# Patient Record
Sex: Female | Born: 1965 | Race: Black or African American | Hispanic: No | Marital: Married | Smoking: Never smoker
Health system: Southern US, Community
[De-identification: ages and names within clinical notes are randomized; demographics above are authoritative.]

## PROBLEM LIST (undated history)

## (undated) DIAGNOSIS — D071 Carcinoma in situ of vulva: Secondary | ICD-10-CM

## (undated) DIAGNOSIS — Z8619 Personal history of other infectious and parasitic diseases: Secondary | ICD-10-CM

## (undated) DIAGNOSIS — L280 Lichen simplex chronicus: Secondary | ICD-10-CM

## (undated) DIAGNOSIS — I10 Essential (primary) hypertension: Secondary | ICD-10-CM

## (undated) DIAGNOSIS — N736 Female pelvic peritoneal adhesions (postinfective): Secondary | ICD-10-CM

## (undated) DIAGNOSIS — Z86018 Personal history of other benign neoplasm: Secondary | ICD-10-CM

## (undated) DIAGNOSIS — M6289 Other specified disorders of muscle: Secondary | ICD-10-CM

## (undated) DIAGNOSIS — F419 Anxiety disorder, unspecified: Secondary | ICD-10-CM

## (undated) DIAGNOSIS — T8859XA Other complications of anesthesia, initial encounter: Secondary | ICD-10-CM

## (undated) DIAGNOSIS — J45998 Other asthma: Secondary | ICD-10-CM

## (undated) DIAGNOSIS — N83201 Unspecified ovarian cyst, right side: Secondary | ICD-10-CM

## (undated) DIAGNOSIS — G8929 Other chronic pain: Secondary | ICD-10-CM

## (undated) DIAGNOSIS — E282 Polycystic ovarian syndrome: Secondary | ICD-10-CM

## (undated) DIAGNOSIS — Z8742 Personal history of other diseases of the female genital tract: Secondary | ICD-10-CM

## (undated) DIAGNOSIS — R102 Pelvic and perineal pain: Secondary | ICD-10-CM

## (undated) DIAGNOSIS — Z86718 Personal history of other venous thrombosis and embolism: Secondary | ICD-10-CM

## (undated) DIAGNOSIS — T4145XA Adverse effect of unspecified anesthetic, initial encounter: Secondary | ICD-10-CM

## (undated) DIAGNOSIS — N9489 Other specified conditions associated with female genital organs and menstrual cycle: Secondary | ICD-10-CM

## (undated) HISTORY — DX: Female pelvic peritoneal adhesions (postinfective): N73.6

## (undated) HISTORY — DX: Pelvic and perineal pain: R10.2

## (undated) HISTORY — PX: CARPAL TUNNEL RELEASE: SHX101

## (undated) HISTORY — PX: WISDOM TOOTH EXTRACTION: SHX21

## (undated) HISTORY — DX: Personal history of other venous thrombosis and embolism: Z86.718

## (undated) HISTORY — DX: Other chronic pain: G89.29

## (undated) HISTORY — DX: Personal history of other diseases of the female genital tract: Z87.42

## (undated) HISTORY — DX: Anxiety disorder, unspecified: F41.9

## (undated) HISTORY — PX: OTHER SURGICAL HISTORY: SHX169

## (undated) HISTORY — DX: Polycystic ovarian syndrome: E28.2

---

## 1997-08-22 ENCOUNTER — Inpatient Hospital Stay (HOSPITAL_COMMUNITY): Admission: AD | Admit: 1997-08-22 | Discharge: 1997-08-22 | Payer: Self-pay | Admitting: Obstetrics and Gynecology

## 1997-09-18 ENCOUNTER — Other Ambulatory Visit: Admission: RE | Admit: 1997-09-18 | Discharge: 1997-09-18 | Payer: Self-pay | Admitting: Obstetrics and Gynecology

## 1997-11-12 ENCOUNTER — Other Ambulatory Visit: Admission: RE | Admit: 1997-11-12 | Discharge: 1997-11-12 | Payer: Self-pay | Admitting: Gynecology

## 1998-06-08 ENCOUNTER — Emergency Department (HOSPITAL_COMMUNITY): Admission: EM | Admit: 1998-06-08 | Discharge: 1998-06-08 | Payer: Self-pay | Admitting: Emergency Medicine

## 1998-06-08 ENCOUNTER — Encounter: Payer: Self-pay | Admitting: Emergency Medicine

## 1998-09-14 ENCOUNTER — Other Ambulatory Visit: Admission: RE | Admit: 1998-09-14 | Discharge: 1998-09-14 | Payer: Self-pay | Admitting: Obstetrics and Gynecology

## 1999-08-17 ENCOUNTER — Other Ambulatory Visit: Admission: RE | Admit: 1999-08-17 | Discharge: 1999-08-17 | Payer: Self-pay | Admitting: Obstetrics and Gynecology

## 1999-11-09 ENCOUNTER — Emergency Department (HOSPITAL_COMMUNITY): Admission: EM | Admit: 1999-11-09 | Discharge: 1999-11-09 | Payer: Self-pay | Admitting: Emergency Medicine

## 2000-08-29 ENCOUNTER — Other Ambulatory Visit: Admission: RE | Admit: 2000-08-29 | Discharge: 2000-08-29 | Payer: Self-pay | Admitting: Obstetrics and Gynecology

## 2001-02-19 ENCOUNTER — Inpatient Hospital Stay (HOSPITAL_COMMUNITY): Admission: AD | Admit: 2001-02-19 | Discharge: 2001-02-19 | Payer: Self-pay | Admitting: Obstetrics and Gynecology

## 2001-03-02 ENCOUNTER — Emergency Department (HOSPITAL_COMMUNITY): Admission: EM | Admit: 2001-03-02 | Discharge: 2001-03-02 | Payer: Self-pay | Admitting: Emergency Medicine

## 2001-07-29 ENCOUNTER — Encounter (INDEPENDENT_AMBULATORY_CARE_PROVIDER_SITE_OTHER): Payer: Self-pay | Admitting: *Deleted

## 2001-07-29 ENCOUNTER — Ambulatory Visit (HOSPITAL_BASED_OUTPATIENT_CLINIC_OR_DEPARTMENT_OTHER): Admission: RE | Admit: 2001-07-29 | Discharge: 2001-07-29 | Payer: Self-pay | Admitting: Specialist

## 2001-07-29 HISTORY — PX: BREAST REDUCTION SURGERY: SHX8

## 2001-08-24 ENCOUNTER — Inpatient Hospital Stay (HOSPITAL_COMMUNITY): Admission: EM | Admit: 2001-08-24 | Discharge: 2001-08-25 | Payer: Self-pay | Admitting: *Deleted

## 2001-09-10 ENCOUNTER — Other Ambulatory Visit: Admission: RE | Admit: 2001-09-10 | Discharge: 2001-09-10 | Payer: Self-pay | Admitting: Obstetrics and Gynecology

## 2002-06-26 DIAGNOSIS — Z86718 Personal history of other venous thrombosis and embolism: Secondary | ICD-10-CM

## 2002-06-26 HISTORY — DX: Personal history of other venous thrombosis and embolism: Z86.718

## 2002-08-15 ENCOUNTER — Encounter: Payer: Self-pay | Admitting: Obstetrics and Gynecology

## 2002-08-15 ENCOUNTER — Ambulatory Visit (HOSPITAL_COMMUNITY): Admission: RE | Admit: 2002-08-15 | Discharge: 2002-08-15 | Payer: Self-pay | Admitting: Obstetrics and Gynecology

## 2002-09-24 ENCOUNTER — Other Ambulatory Visit: Admission: RE | Admit: 2002-09-24 | Discharge: 2002-09-24 | Payer: Self-pay | Admitting: Obstetrics and Gynecology

## 2003-03-17 ENCOUNTER — Ambulatory Visit (HOSPITAL_COMMUNITY): Admission: RE | Admit: 2003-03-17 | Discharge: 2003-03-17 | Payer: Self-pay | Admitting: Obstetrics and Gynecology

## 2003-03-17 ENCOUNTER — Encounter: Payer: Self-pay | Admitting: Obstetrics and Gynecology

## 2003-08-10 ENCOUNTER — Emergency Department (HOSPITAL_COMMUNITY): Admission: EM | Admit: 2003-08-10 | Discharge: 2003-08-10 | Payer: Self-pay

## 2003-09-12 ENCOUNTER — Emergency Department (HOSPITAL_COMMUNITY): Admission: AD | Admit: 2003-09-12 | Discharge: 2003-09-12 | Payer: Self-pay | Admitting: Family Medicine

## 2003-09-29 ENCOUNTER — Other Ambulatory Visit: Admission: RE | Admit: 2003-09-29 | Discharge: 2003-09-29 | Payer: Self-pay | Admitting: Obstetrics and Gynecology

## 2003-12-09 ENCOUNTER — Emergency Department (HOSPITAL_COMMUNITY): Admission: EM | Admit: 2003-12-09 | Discharge: 2003-12-09 | Payer: Self-pay | Admitting: Emergency Medicine

## 2004-06-16 ENCOUNTER — Encounter: Admission: RE | Admit: 2004-06-16 | Discharge: 2004-06-16 | Payer: Self-pay | Admitting: Internal Medicine

## 2004-09-07 ENCOUNTER — Encounter: Admission: RE | Admit: 2004-09-07 | Discharge: 2004-12-06 | Payer: Self-pay | Admitting: Occupational Medicine

## 2004-10-05 ENCOUNTER — Other Ambulatory Visit: Admission: RE | Admit: 2004-10-05 | Discharge: 2004-10-05 | Payer: Self-pay | Admitting: Obstetrics and Gynecology

## 2004-11-08 ENCOUNTER — Ambulatory Visit (HOSPITAL_COMMUNITY): Admission: RE | Admit: 2004-11-08 | Discharge: 2004-11-08 | Payer: Self-pay | Admitting: Obstetrics and Gynecology

## 2005-02-02 ENCOUNTER — Encounter (INDEPENDENT_AMBULATORY_CARE_PROVIDER_SITE_OTHER): Payer: Self-pay | Admitting: Specialist

## 2005-02-02 ENCOUNTER — Ambulatory Visit (HOSPITAL_COMMUNITY): Admission: RE | Admit: 2005-02-02 | Discharge: 2005-02-02 | Payer: Self-pay | Admitting: Obstetrics and Gynecology

## 2005-02-02 HISTORY — PX: OTHER SURGICAL HISTORY: SHX169

## 2005-04-08 ENCOUNTER — Ambulatory Visit (HOSPITAL_COMMUNITY): Admission: RE | Admit: 2005-04-08 | Discharge: 2005-04-08 | Payer: Self-pay | Admitting: Family Medicine

## 2005-08-21 ENCOUNTER — Encounter: Admission: RE | Admit: 2005-08-21 | Discharge: 2005-08-21 | Payer: Self-pay | Admitting: Internal Medicine

## 2005-09-14 ENCOUNTER — Encounter (INDEPENDENT_AMBULATORY_CARE_PROVIDER_SITE_OTHER): Payer: Self-pay | Admitting: *Deleted

## 2005-09-14 ENCOUNTER — Inpatient Hospital Stay (HOSPITAL_COMMUNITY): Admission: RE | Admit: 2005-09-14 | Discharge: 2005-09-16 | Payer: Self-pay | Admitting: Obstetrics and Gynecology

## 2005-09-14 HISTORY — PX: ABDOMINAL HYSTERECTOMY: SHX81

## 2006-07-17 ENCOUNTER — Emergency Department (HOSPITAL_COMMUNITY): Admission: EM | Admit: 2006-07-17 | Discharge: 2006-07-17 | Payer: Self-pay | Admitting: Emergency Medicine

## 2007-05-15 ENCOUNTER — Encounter: Admission: RE | Admit: 2007-05-15 | Discharge: 2007-05-15 | Payer: Self-pay | Admitting: Internal Medicine

## 2007-05-17 ENCOUNTER — Encounter: Admission: RE | Admit: 2007-05-17 | Discharge: 2007-05-17 | Payer: Self-pay | Admitting: Internal Medicine

## 2007-07-01 ENCOUNTER — Encounter: Admission: RE | Admit: 2007-07-01 | Discharge: 2007-07-01 | Payer: Self-pay | Admitting: Obstetrics and Gynecology

## 2008-03-10 ENCOUNTER — Emergency Department (HOSPITAL_COMMUNITY): Admission: EM | Admit: 2008-03-10 | Discharge: 2008-03-11 | Payer: Self-pay | Admitting: Emergency Medicine

## 2008-04-20 ENCOUNTER — Encounter: Admission: RE | Admit: 2008-04-20 | Discharge: 2008-04-20 | Payer: Self-pay | Admitting: Obstetrics and Gynecology

## 2008-10-12 ENCOUNTER — Ambulatory Visit: Payer: Self-pay | Admitting: *Deleted

## 2008-10-12 ENCOUNTER — Encounter (INDEPENDENT_AMBULATORY_CARE_PROVIDER_SITE_OTHER): Payer: Self-pay | Admitting: Internal Medicine

## 2008-10-12 ENCOUNTER — Ambulatory Visit (HOSPITAL_COMMUNITY): Admission: RE | Admit: 2008-10-12 | Discharge: 2008-10-12 | Payer: Self-pay | Admitting: Internal Medicine

## 2008-12-12 ENCOUNTER — Emergency Department (HOSPITAL_COMMUNITY): Admission: EM | Admit: 2008-12-12 | Discharge: 2008-12-12 | Payer: Self-pay | Admitting: Family Medicine

## 2009-11-24 ENCOUNTER — Encounter: Admission: RE | Admit: 2009-11-24 | Discharge: 2009-11-24 | Payer: Self-pay | Admitting: Internal Medicine

## 2009-12-10 ENCOUNTER — Encounter: Admission: RE | Admit: 2009-12-10 | Discharge: 2009-12-10 | Payer: Self-pay | Admitting: Internal Medicine

## 2010-11-11 NOTE — Op Note (Signed)
NAMEELLIETT, [name]               ACCOUNT NO.:  [id number]   MEDICAL RECORD NO.:  [id number]          PATIENT TYPE:  AMB   LOCATION:  SDC                           FACILITY:  [facility]   PHYSICIAN:  Hal Morales, M.D.DATE OF BIRTH:  1965/10/15   DATE OF PROCEDURE:  02/02/2005  DATE OF DISCHARGE:                                 OPERATIVE REPORT   PREOPERATIVE DIAGNOSES:  1.  Abnormal uterine bleeding.  2.  Uterine fibroids.  3.  Polycystic ovarian syndrome.   POSTOPERATIVE DIAGNOSES:  1.  Abnormal uterine bleeding.  2.  Uterine fibroids.  3.  Polycystic ovarian syndrome.   OPERATION:  1.  Hysteroscopy.  2.  Resection of uterine fibroids.  3.  Dilatation and curettage.   SURGEON:  Dr. Dierdre Forth.   ANESTHESIA:  General LMA.   ESTIMATED BLOOD LOSS:  Less than 50 mL.   COMPLICATIONS:  None.   FINDINGS:  The uterus sounded to 7 cm.  There were two small uterine  fibroids that were submucosal, one at the right posterior uterus and one at  the left posterior uterus, both approximately 1 cm in diameter.  The  endometrial cavity fluid for the most part was atrophic, though there were  some areas with evidence of thicker endometrium.  The tubal ostia were  visualized on either side.   PROCEDURE:  The patient was taken to the operating room after appropriate  identification and placed on the operating table.  After the attainment of  general anesthesia, she was placed in the lithotomy position.  The perineum  and vagina were prepped with multiple layers of Betadine and the bladder  emptied with a red Robinson catheter.  The perineum was draped as a sterile  field.  A Graves speculum was placed in the vagina and a paracervical block  achieved with a total of 10 mL of 2% Xylocaine in the 5 and 7 o'clock  positions.  A single-tooth tenaculum was placed on the cervix and the uterus  sounded.  The cervix was then dilated to accommodate the diagnostic  hysteroscope, and the  above-noted findings were made and documented.  The  cervix was further dilated to accommodate the operative hysteroscope, and it  was used to shave the aforementioned right and left posterior submucosal  fibroids from the submucosa.  Those specimens were removed, and the uterus  was curetted in all four  quadrants.  Hemostasis was noted to be adequate, and all instruments were  removed from the vagina.  The fluid deficit was noted to be 80 mL.   SPECIMENS TO PATHOLOGY:  Endometrial curettings and fibroids.      Hal Morales, M.D.  Electronically Signed     VPH/MEDQ  D:  02/02/2005  T:  02/02/2005  Job:  [id number]

## 2010-11-11 NOTE — H&P (Signed)
NAMEHEMA, LANZA NO.:  [id number]   MEDICAL RECORD NO.:  [id number]           PATIENT TYPE:   LOCATION:                                 FACILITY:   PHYSICIAN:  Hal Morales, M.D.DATE OF BIRTH:  [birth year]   DATE OF ADMISSION:  DATE OF DISCHARGE:                                HISTORY & PHYSICAL   HISTORY OF PRESENT ILLNESS:  [name] is a 45 year old married African-  American female, gravida 0, who presents for a total abdominal hysterectomy  because of chronic pelvic pain, chronic pelvic inflammatory disease,  polycystic ovarian syndrome, and uterine fibroids.  For over 15 years the  patient has suffered with severe (10/10 on a 10-point pain scale) menstrual  cramps along with random and episodic pelvic pain which is greater on the  left than the right.  She knows of no aggravating factors but has only found  significant relief with Wygesic analgesia, and on occasion, nonsteroidal  anti-inflammatory medications.  After three laparoscopies for pelvic pain  and lysis of adhesions the patient was seen at  [facility] by a pelvic  pain specialist.  It was concluded that in addition to her history of PID  and pelvic adhesive disease that the patient's polycystic ovarian disease  was a significant contributing factor to her pelvic discomfort.  To manage  the polycystic ovarian effect the patient was placed on birth control pills  which helped somewhat and regulated her pattern of oligomenorrhea.  While  trying to conceive the patient again resumed a pattern of irregular bleeding  and severe pelvic pain.  Having developed a DVT in 2003, oral contraceptives  were no longer a management option, therefore the patient was given a  progestogen episodically to manage her bleeding.  Additionally a narcotic  along with nonsteroidal anti-inflammatory pain medicines were also given for  her discomfort.  Eventually the patient began bleeding heavily for as many  as 14 days accompanied by clotting and debilitating pain in spite of her  therapies.  She was then placed on Lupron-Depo for six months to which the  patient responded with a menorrhea and a significant decrease in her pelvic  pain.  Following this course of treatment, Provera and Wygesic were  reinitiated.  In spite of this the patient again resumed an irregular  bleeding pattern.  Pelvic ultrasound in May of 2006 showed an  intramural/submucosal fibroid measuring 3.5 x 3.8 x 3.9 cm and a posterior  intramural fibroid measuring 18 x 19 x 15 mm.  The patient underwent a  hysteroscopy with D&C and fibroid resection in August of 2006 for this  finding.  After this procedure the patient's bleeding normalized somewhat in  that it was regular; however, her flow was for nine days during which time  she would change a pad every 35-40 minutes with occasional soiling of her  clothes.  The pelvic pain she once endured persisted with the addition of  severe cramping one week prior to her menstrual flow which radiated down her  legs and was quite debilitating.  By this time the patient's  pain only  minimally responded to her previous analgesic therapy.  Given the chronicity  of her symptoms along with its debilitating effect and disruption to her  lifestyle, the patient has opted to pursue definitive therapy in the form of  hysterectomy.   OB HISTORY:  Gravida 0.   GYN HISTORY:  Menarche 45 years old.  Last menstrual period was August 23, 2005  (see history of present illness).  The patient does not use anything  for contraception.  She does have a remote history of gonorrhea.  She also  has Herpes simplex virus #2.  She has a history of an abnormal Pap smear in  1994; however, Pap smears have remained normal since that time with her most  recent being April 2006.   PAST MEDICAL HISTORY:  1.  DVT (following reduction mammoplasty surgery while on oral      contraceptives; the patient's  coagulopathy studies proved negative).  2.  Irritable bowel syndrome.  3.  Polycystic ovarian syndrome.  4.  Fibroids.  5.  Infertility.  6.  Pelvic inflammatory disease.  7.  Insulin resistance.  8.  Metabolic syndrome.  9.  Asthma.  10. Anxiety.   FAMILY HISTORY:  Positive for hypertension, diabetes, and peptic ulcer  disease.   PAST SURGICAL HISTORY:  1.  1994, diagnostic laparoscopy with lysis of adhesions.  2.  1995, diagnostic laparoscopy with lysis of adhesions.  3.  1996, diagnostic laparoscopy with lysis of adhesions.  4.  2003, reduction mammoplasty.  5.  2006, hysteroscopy with resection of submucosal fibroid.  The patient      denies any problems with anesthesia and denies blood transfusions.   HABITS:  She does not use alcohol or tobacco.   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   CURRENT MEDICATIONS:  1.  Hydrochlorothiazide 12.5 mg daily.  2.  Avandia 4 mg daily.  3.  Vitamin E 400 international units daily.   ALLERGIES:  1.  The patient is allergic to [facility] which causes itching.  2.  VICODIN causes itching.  3.  The patient has been advised to avoid NONSTEROIDAL ANTI-INFLAMMATORY      MEDICATIONS due to her history of gastrointestinal problems.   REVIEW OF SYSTEMS:  Seasonal allergies.  The patient does not wear glasses  or dentures.  Except as mentioned in history of present illness, review of  systems is negative.   PHYSICAL EXAMINATION:  VITAL SIGNS:  Blood pressure 126/80, height 5 feet 2  inches tall, weight 212 pounds.  NECK:  Supple without masses.  There is no adenopathy or thyromegaly.  HEART:  Regular rate and rhythm.  There is no murmur.  LUNGS:  Clear to auscultation.  There are no wheezes, rales, or rhonchi.  BACK:  No CVA tenderness.  ABDOMEN:  Bowel sounds are present.  It is soft.  It is diffusely tender without organomegaly.  The patient is noted to have  a tender erythematous 4  x 3 cm macular area with a central 0.5 cm tender, pigmented, fluctuant  lesion without pointing.  EXTREMITIES:  Without clubbing, cyanosis, or edema.  PELVIC:  EGBUS is within normal limits.  Vagina is rugous.  Cervix is  nontender without lesions.  Uterus appears upper limits of normal size,  though exam is limited by body habitus.  There is some tenderness over  apparent fundus.  Adnexa without tenderness or masses.  Rectovaginal  without  tenderness or masses.   IMPRESSION:  1.  Pelvic pain.  2.  Chronic pelvic inflammatory disease.  3.  Uterine fibroids.  4.  Polycystic ovarian syndrome.  5.  Left abdominal lesion.   DISPOSITION:  A discussion was held with the patient regarding the  indications for her procedure along with this risk, which include, but are  not limited to, reaction to anesthesia, damage to adjacent organs,  infection, or excessive bleeding.  The patient also understands that relief  of her pelvic pain cannot be guaranteed by this procedure and that it may  become necessary to remove one or both of her ovaries should they prove to  be diseased.  The patient has consented to proceed with a total abdominal  hysterectomy at [facility] on September 14, 2005, at 7:30  a.m.      Elmira J. Adline Peals.      Hal Morales, M.D.  Electronically Signed    EJP/MEDQ  D:  09/09/2005  T:  09/09/2005  Job:  [id number]

## 2010-11-11 NOTE — H&P (Signed)
NAME:  [name], CELLI NO.:  [id number]   MEDICAL RECORD NO.:  [id number]           PATIENT TYPE:   LOCATION:                                 FACILITY:   PHYSICIAN:  Hal Morales, M.D.     DATE OF BIRTH:   DATE OF ADMISSION:  02/02/2005  DATE OF DISCHARGE:                                HISTORY & PHYSICAL   HISTORY OF PRESENT ILLNESS:  The patient is a 45 year old black single  female para 0 who presents for evaluation and management of abnormal uterine  bleeding.  She gives a three-month history of menses which have occurred  with intramenstrual bleeding off and on, especially after intercourse.  She  had a normal Pap smear in April of 2006 and has had normal Pap smears since  a single episode of ASCUS on Pap in 1994.  She had spontaneous menses on May  24 lasting for three days and took Prometrium on June 14.  She started  bleeding on June 27 and continued through June 10.  She had intramenstrual  bleeding off and on between those two identifiable menstrual periods.   She has a long history of irregular menses with polycystic ovarian syndrome  and insulin resistance and often has menses only if she takes progesterone  for withdrawal.   She has a history of uterine fibroids with the first diagnosis being made in  2004 by ultrasound.  She has a long history of pelvic pain which has been  attributed to her polycystic ovarian syndrome and history of chronic pelvic  inflammatory disease.   PAST MEDICAL HISTORY:  1.  Deep venous thrombosis in 2003 after a reduction mammoplasty while she      was on oral contraceptives.  She was treated for heparin and then      Coumadin for six months with no further therapy and no further      difficulty.  2.  Insulin resistance diagnosed in 2001 based on high insulin levels and      treated initially with Glucophage and most recently with Avandia.  3.  Probable metabolic syndrome with elevated total cholesterol and  elevated      LDL cholesterol.  4.  Irritable bowel syndrome followed by Dr. Matthias Hughs.   GYN HISTORY:  The patient has a long history of infertility thought to be  multifactorial with chronic anovulation, pelvic adhesive disease, and  suggestion of salpingitis isthmica nodosum, history of pelvic inflammatory  disease treated with intravenous antibiotics diagnosed at laparoscopy,  history of gonorrhea in 1991 treated as an outpatient, history of ASCUS on  Pap in 1994 with no therapy required and normal Pap smears since that time,  chronic pelvic pain treated in 1999 with Lupron with no improvement, HSV II  with infrequent outbreaks.   FAMILY HISTORY:  Positive for hypertension and diabetes.   REVIEW OF SYSTEMS:  Positive for the above mentioned intramenstrual  bleeding, intermittent pelvic pain especially with menses, current complaint  of gray vaginal discharge noted to be consistent with bacterial vaginosis on  examination on January 25, 2005.  CURRENT MEDICATIONS:  1.  Hydrochlorothiazide 25 mg a day.  2.  Avandia 4 mg a day.  3.  Zyrtec one tablet p.o. daily p.r.n.  4.  Multivitamins.   ALLERGIES:  PERCOCET.   PHYSICAL EXAMINATION:  GENERAL:  The patient is an obese black female in no  acute distress.  VITAL SIGNS:  Blood pressure 130/80.  LUNGS:  Clear.  HEART:  Regular rate and rhythm.  ABDOMEN:  Obese without palpable masses or organomegaly.  There is no point  tenderness and no rebound.  PELVIC:  EGBUS within normal limits.  The vagina is rugose.  The cervix is  without gross lesions.  The uterus does not feel enlarged but the  examination is compromised by body habitus.  Adnexa:  No palpable masses.  Rectovaginal confirms.   IMPRESSION:  1.  Abnormal uterine bleeding.  2.  History of fibroids.  3.  Polycystic ovarian syndrome.  4.  Recently treated bacterial vaginosis.  5.  Obesity.  6.  History of pelvic inflammatory disease with pelvic adhesions.   DISPOSITION:   Discussion is held with the patient concerning the management  of her abnormal uterine bleeding and the suggestion made for hysteroscopy,  removal of any intracavitary fibroids, and D&C.  The suggestion is made  specifically because the patient has a history of polycystic ovarian  syndrome and is at risk for endometrial hyperplasia.  The risks of  anesthesia, bleeding, infection, damage to adjacent organs, and possible  perforation of the uterus were explained to the patient and she seemed to  understand.  Her questions were answered and she wishes to proceed with the  aforementioned surgical procedure at [facility] on February 02, 2005.      Hal Morales, M.D.  Electronically Signed     VPH/MEDQ  D:  02/02/2005  T:  02/02/2005  Job:  [id number]

## 2010-11-11 NOTE — Op Note (Signed)
[facility]  Patient:    [name], [name] Visit Number: [id number] MRN: [id number]          Service Type: DSU Location: [facility] Attending Physician:  Gustavus Messing Dictated by:   Yaakov Guthrie. Shon Hough, M.D. Admit Date:  07/29/2001                             Operative Report  INDICATIONS:  [age]-year-old lady with severe, severe macromastia, wears a 44-E bra, increased pain and discomfort and pitting in both shoulder areas and history of intertrigo.  PREOPERATIVE DIAGNOSIS:  POSTOPERATIVE DIAGNOSIS:  PROCEDURES:  Bilateral breast reductions using the inferior pedicle technique.  SURGEON:  Yaakov Guthrie. Shon Hough, M.D.  ASSISTANT:  Margaretha Sheffield.  ANESTHESIA:  General anesthesia.  DESCRIPTION OF PROCEDURE:  Preoperatively, the patient was sat up and drawn for the inferior pedicle reduction mammoplasties, remarking the nipple-areolar complexes from over 38 cm from the suprasternal notch to 20.  She then underwent general anesthesia and intubated orally.  A prep was done to the chest and breast areas in a routine fashion using Betadine soaping solution and walled off with sterile towels and drapes so as to make a sterile field. Xylocaine 0.25% in 1:400,000 concentration with epinephrine was injected, 150 cc per side.  After waiting an appropriate amount of time, the wounds were scored with #15 blades, then the skin of the inferior pedicles de-epithelialized using #20 blades.  The new keyhole areas were debulked. Hemostasis was maintained with the Bovie unit throughout the procedure or coagulation.  Next, laterally, more tissue was removed to improve the symmetry and then accessory breast tissue was removed directly using the Metzenbaum scissors and the Bovie as well as liposuction assistance, removing approximately 500 cc of accessory breast tissue.  After proper hemostasis, the flaps were transposed and stayed with 3-0 Prolene.   Subcutaneous closure was done with 3-0 Monocryl x2 layers and then running subcuticular stitches of 3-0 Monocryl and 5-0 Monocryl throughout the inverted T.  The wounds were drained with #15 and #10 Blake drains which were placed in the depths of the wounds and brought out through the lateral-most portion of the incisions and secured with 3-0 Prolene.  Steri-Strips and soft dressings were applied to all the areas including one-inch paper tape, Xeroform, 4 x 4s, ABDs and Hypafix. She withstood the procedures very well and was taken to recovery in excellent condition. Dictated by:   Yaakov Guthrie. Shon Hough, M.D. Attending Physician:  Gustavus Messing DD:  07/29/01 TD:  07/29/01 Job: [id number]

## 2010-11-11 NOTE — Op Note (Signed)
[name], [name]               ACCOUNT NO.:  [id number]   MEDICAL RECORD NO.:  [id number]          PATIENT TYPE:  INP   LOCATION:  9303                          FACILITY:  [facility]   PHYSICIAN:  Hal Morales, M.D.DATE OF BIRTH:  [birth year]   DATE OF PROCEDURE:  09/14/2005  DATE OF DISCHARGE:                                 OPERATIVE REPORT   PREOPERATIVE DIAGNOSIS:  Is uterine fibroids, pelvic pain, chronic pelvic  inflammatory disease, polycystic ovarian syndrome.   POSTOPERATIVE DIAGNOSES:  Is uterine fibroids, pelvic pain, chronic pelvic  inflammatory disease, polycystic ovarian syndrome.   OPERATION:  Total abdominal hysterectomy, bilateral salpingectomy.   SURGEON:  Hal Morales, M.D.   FIRST ASSISTANT:  Elmira J. Adline Peals.   ANESTHESIA:  Combination spinal and epidural.   ESTIMATED BLOOD LOSS:  100 mL.   COMPLICATIONS:  None.   FINDINGS:  The uterus was upper limits normal size with a darkened area on  the posterior left cornual region suspicious for endometriosis. The tubes  were normal bilaterally except for adherent at the fimbrial end to the  ovaries. The ovaries were slightly enlarged bilaterally and symmetrically  consistent with polycystic ovarian syndrome. There were filmy adhesions  between the ovaries and posterior uterus and left pelvic sidewall.   PROCEDURE:  The patient was taken to the operating room after appropriate  identification and placed on the operating table. After placement of  regional anesthesia  she was placed in the supine position with a left  lateral tilt. The abdomen, perineum and vagina were prepped with multiple  layers of Betadine and a Foley catheter inserted into the bladder under  sterile conditions and connected to straight drainage. The abdomen was  draped in sterile field. After assuring adequate surgical anesthesia. A  suprapubic incision was made and the abdomen opened in layers. The  peritoneum was entered and  the self-retaining O'Connor-O'Sullivan retractor  placed. The bowel was packed cephalad. The uterus was elevated with Kelly  clamps at the cornual regions and the left round ligament suture ligated,  incised and that incision taken anteriorly on the anterior leaf of the broad  ligament. The utero-ovarian ligament was then clamped, cut and suture  ligated. The similar procedure was carried out on the right side with the  round ligament and utero-ovarian ligament. The uterine arteries were  skeletonized and clamped, cut and suture ligated on the right and left side.  The parametrial tissues, paracervical tissues, uterosacral ligament and  vaginal angles were successively clamped, cut and suture ligated and then  the cervix excised from the upper vagina. The vaginal cuff was closed with  figure-of-eight sutures of 0 Vicryl. The sutures holding the uterosacral  ligaments and vaginal angles were held and then tied together. Copious  irrigation was carried out and hemostasis achieved in the vaginal cuff with  Bovie cautery. The right fallopian tube was then elevated, clamped, and  excised and the cut ends tied with suture ligatures of 2-0 Vicryl. This was  achieved on the right and left side for removal of both the tubes from the  adjacent ovary.  They were removed from the operative field. Once copious  irrigation had been carried out and hemostasis noted to be adequate. All  instruments were removed from the peritoneal cavity and the abdominal  peritoneum closed with a running suture of 2-0 Vicryl. The rectus muscles  were reapproximated in the midline and copious irrigation carried out.  Hemostasis was achieved in the rectus muscles with Bovie cautery. The rectus  fascia was closed with running sutures from each apex to the midline and  tied in midline. A 7 mm Jackson-Pratt drain was placed in the subcutaneous  tissue through a stab wound in the left lower quadrant and tied down with a  suture  of 0 silk. The skin incision was closed with a subcuticular suture of  3-0 Monocryl.   It should be noted that after the round ligament had been suture ligated and  incised on either side, the bladder was sharply dissected off the anterior  cervix prior to clamping the uterine arteries on the right and left sides.  All sutures used except as mentioned were 0 Vicryl.      Hal Morales, M.D.  Electronically Signed     VPH/MEDQ  D:  09/14/2005  T:  09/15/2005  Job:  [id number]

## 2010-11-11 NOTE — H&P (Signed)
[place]  Patient:    [name], [name] Visit Number: [id number] MRN: [id number]          Service Type: MED Location: 1E 0101 01 Attending Physician:  Corlis Leak. Dictated by:   Wayne C. Dorna Bloom, M.D. Admit Date:  08/24/2001   CC:         Gracelyn Nurse, M.D.  Yaakov Guthrie. Shon Hough, M.D.   History and Physical  CHIEF COMPLAINT: Left leg pain and swelling.  HISTORY OF PRESENT ILLNESS: This is a 45 year old black female, with recent breast reduction surgery by Dr. Louisa Second three weeks prior, somewhat inactive since then, complaining of left leg pain x1 week and increased swelling x2 days.  REVIEW OF SYSTEMS: Negative for chest pain, shortness of breath, fever, or trauma.  PAST MEDICAL HISTORY: Polycystic ovary disease.  PAST SURGICAL HISTORY:  1. As above.  2. Laparoscopic surgery.  3. Wisdom teeth removal.  MEDICATIONS:  1. Birth control pills.  2. Vitamin E.  ALLERGIES: PERCOCET causes itching.  FAMILY HISTORY: Parents have hypertension.  No DVT or PE in the family. Grandmother with ovarian cancer.  SOCIAL HISTORY: The patient is married.  No children.  Routes school buses for a living.  Denies tobacco, alcohol, or recreational drug abuse.  PHYSICAL EXAMINATION:  VITAL SIGNS: Blood pressure 142/86, pulse 94, respiratory rate 18, TEMP 98.7 degrees.  GENERAL: The patient looks well, in no distress.  HEENT: Normal.  CARDIOVASCULAR: Regular rate and rhythm without murmurs.  LUNGS: Clear to auscultation.  ABDOMEN: Soft, normoactive bowel sounds.  Nontender.  EXTREMITIES: Left leg swelling, tenderness, and warmth.  LABORATORY DATA: Doppler examination shows left leg popliteal obstructive DVT.  ASSESSMENT: Left leg deep vein thrombosis.  PLAN: Lovenox and Coumadin.  Also stop birth control pills.  Deep vein thrombosis is questionably secondary to this or due to surgery and convalesence.  Also do  hypercoagulable work-up. Dictated by:   Wayne C. Dorna Bloom, M.D. Attending Physician:  Corlis Leak DD:  08/24/01 TD:  08/25/01 Job: [id number]

## 2011-05-09 ENCOUNTER — Other Ambulatory Visit: Payer: Self-pay | Admitting: Internal Medicine

## 2011-05-09 DIAGNOSIS — R42 Dizziness and giddiness: Secondary | ICD-10-CM

## 2011-05-09 DIAGNOSIS — R55 Syncope and collapse: Secondary | ICD-10-CM

## 2011-05-11 ENCOUNTER — Ambulatory Visit
Admission: RE | Admit: 2011-05-11 | Discharge: 2011-05-11 | Disposition: A | Discharge status: Home / Self Care | Payer: BC Managed Care – PPO | Source: Ambulatory Visit | Attending: Internal Medicine | Admitting: Internal Medicine

## 2011-05-11 DIAGNOSIS — R42 Dizziness and giddiness: Secondary | ICD-10-CM

## 2011-05-11 DIAGNOSIS — R55 Syncope and collapse: Secondary | ICD-10-CM

## 2011-05-11 MED ORDER — IOHEXOL 300 MG/ML  SOLN
75.0000 mL | Freq: Once | INTRAMUSCULAR | Status: AC | PRN
  Administered 2011-05-11: 75 mL via INTRAVENOUS

## 2011-10-09 ENCOUNTER — Ambulatory Visit (HOSPITAL_COMMUNITY)
Admission: RE | Admit: 2011-10-09 | Discharge: 2011-10-09 | Disposition: A | Discharge status: Home / Self Care | Payer: BC Managed Care – PPO | Source: Ambulatory Visit | Attending: Physician Assistant | Admitting: Physician Assistant

## 2011-10-09 DIAGNOSIS — M79609 Pain in unspecified limb: Secondary | ICD-10-CM

## 2011-10-09 DIAGNOSIS — M7989 Other specified soft tissue disorders: Secondary | ICD-10-CM

## 2011-10-09 DIAGNOSIS — R52 Pain, unspecified: Secondary | ICD-10-CM

## 2011-10-09 NOTE — Progress Notes (Signed)
Left lower extremity venous duplex completed.  Preliminary report is negative for DVT, SVT, or a Baker's cyst in the left leg.  Negative for DVT in the right common femoral vein. 

## 2011-10-11 ENCOUNTER — Ambulatory Visit: Payer: Self-pay | Admitting: Obstetrics and Gynecology

## 2011-10-12 ENCOUNTER — Encounter: Payer: Self-pay | Admitting: Obstetrics and Gynecology

## 2011-10-12 ENCOUNTER — Other Ambulatory Visit: Payer: Self-pay

## 2011-10-12 ENCOUNTER — Ambulatory Visit (INDEPENDENT_AMBULATORY_CARE_PROVIDER_SITE_OTHER): Payer: BC Managed Care – PPO | Admitting: Obstetrics and Gynecology

## 2011-10-12 ENCOUNTER — Telehealth: Payer: Self-pay

## 2011-10-12 VITALS — BP 124/100 | Ht 63.0 in | Wt 202.0 lb

## 2011-10-12 DIAGNOSIS — J302 Other seasonal allergic rhinitis: Secondary | ICD-10-CM

## 2011-10-12 DIAGNOSIS — J3489 Other specified disorders of nose and nasal sinuses: Secondary | ICD-10-CM

## 2011-10-12 DIAGNOSIS — R51 Headache: Secondary | ICD-10-CM

## 2011-10-12 DIAGNOSIS — I10 Essential (primary) hypertension: Secondary | ICD-10-CM | POA: Insufficient documentation

## 2011-10-12 DIAGNOSIS — Z01419 Encounter for gynecological examination (general) (routine) without abnormal findings: Secondary | ICD-10-CM | POA: Insufficient documentation

## 2011-10-12 NOTE — Progress Notes (Signed)
Last Pap:  WNL: Yes 09/25/2007-WNL Regular Periods:no  Monthly Breast exam:yes Tetanus<82yrs:yes Nl.Bladder Function:yes Daily BMs:yes Healthy Diet:yes Calcium:yes Mammogram:yes 09/27/11-WNL per pt @[facility]  Exercise:no Seatbelt: yes Abuse at home: no Stressful work:no Sigmoid-colonoscopy: no

## 2011-10-12 NOTE — Progress Notes (Signed)
Subjective:    [name] is a 46 y.o. female, G0P0000, who presents for an annual exam. She has had several episodes of pelvic pressure with ovulation that resolve over 4-5 days associated with urinary frequency.  On 10/07/11 she was seen at Urgent Care for RLQ pain with a negative urine test, and pain has resolved.  She has had some mood swings during ovulation as well. She has had a DVT and is not a candidate for hormonal therapy    History   Social History  . Marital Status: Married    Spouse Name: N/A    Number of Children: N/A  . Years of Education: N/A   Social History Main Topics  . Smoking status: Never Smoker   . Smokeless tobacco: None  . Alcohol Use: No  . Drug Use: No  . Sexually Active: Yes    Birth Control/ Protection: Surgical   Other Topics Concern  . None   Social History Narrative  . None    Menstrual cycle:   LMP: No LMP recorded. Patient has had a hysterectomy.           Cycle: hyst  The following portions of the patient's history were reviewed and updated as appropriate: allergies, current medications, past family history, past medical history, past social history, past surgical history and problem list. Has headaches this time of year from sinus pressure. Fam hx HBP with primary MD having difficulty controlling her BP.  Will see primary MD on 10/13/11   Review of Systems Pertinent items are noted in HPI. Breast:Negative for breast lump,nipple discharge or nipple retraction Gastrointestinal: Negative for abdominal pain, change in bowel habits or rectal bleeding except as above Urinary:negative   Objective:    BP 124/100  Ht 5\' 3"  (1.6 m)  Wt 202 lb (91.627 kg)  BMI 35.78 kg/m2    Weight:  Wt Readings from Last 1 Encounters:  10/12/11 202 lb (91.627 kg)          BMI: Body mass index is 35.78 kg/(m^2).  General Appearance: Alert, appropriate appearance for age. No acute distress HEENT: Grossly normal Neck / Thyroid: Supple, no masses,  nodes or enlargement Lungs: clear to auscultation bilaterally Back: No CVA tenderness Breast Exam: s/p mammoplasty reduction Cardiovascular: Regular rate and rhythm. S1, S2, no murmur Gastrointestinal: Soft, non-tender, no masses or organomegaly Pelvic Exam: Vulva and vagina appear normal. Bimanual exam reveals normal uterus and adnexa. External genitalia: normal general appearance Vaginal: normal mucosa without prolapse or lesions Cervix: removed surgically Adnexa: non palpable Uterus: absent Exam limited by body habitus Rectovaginal: normal rectal, no masses Lymphatic Exam: Non-palpable nodes in neck, clavicular, axillary, or inguinal regions Skin: no rash or abnormalities Neurologic: Normal gait and speech, no tremor  Psychiatric: Alert and oriented, appropriate affect.   Wet Prep:not applicable Urinalysis:not applicable UPT: N/A, Not done   Assessment:    Nl post hyst exam  Hx CPP, improved HBP Allergic sinusitis   Plan:    mammogram return annually or prn Call for persistent  pain STD screening: discussed, declined Contraception:s/p hyst  Referral Allergist   [name] PMD

## 2011-10-12 NOTE — Telephone Encounter (Signed)
TC TO PT PER REFERRAL TO DR.HICKS PER [facility]. APPT SCHED 10/30/11@1 :30P. PT VOICES UNDERSTANDING.

## 2011-10-12 NOTE — Patient Instructions (Signed)
Allergic Rhinitis  Allergic rhinitis is when the mucous membranes in the nose respond to allergens. Allergens are particles in the air that cause your body to have an allergic reaction. This causes you to release allergic antibodies. Through a chain of events, these eventually cause you to release histamine into the blood stream (hence the use of antihistamines). Although meant to be protective to the body, it is this release that causes your discomfort, such as frequent sneezing, congestion and an itchy runny nose.    CAUSES    The pollen allergens may come from grasses, trees, and weeds. This is seasonal allergic rhinitis, or "hay fever." Other allergens cause year-round allergic rhinitis (perennial allergic rhinitis) such as house dust mite allergen, pet dander and mold spores.    SYMPTOMS     Nasal stuffiness (congestion).   Runny, itchy nose with sneezing and tearing of the eyes.   There is often an itching of the mouth, eyes and ears.  It cannot be cured, but it can be controlled with medications.  DIAGNOSIS    If you are unable to determine the offending allergen, skin or blood testing may find it.  TREATMENT     Avoid the allergen.   Medications and allergy shots (immunotherapy) can help.   Hay fever may often be treated with antihistamines in pill or nasal spray forms. Antihistamines block the effects of histamine. There are over-the-counter medicines that may help with nasal congestion and swelling around the eyes. Check with your caregiver before taking or giving this medicine.  If the treatment above does not work, there are many new medications your caregiver can prescribe. Stronger medications may be used if initial measures are ineffective. Desensitizing injections can be used if medications and avoidance fails. Desensitization is when a patient is given ongoing shots until the body becomes less sensitive to the allergen. Make sure you follow up with your caregiver if problems continue.  SEEK  MEDICAL CARE IF:     You develop fever (more than 100.5 F (38.1 C).   You develop a cough that does not stop easily (persistent).   You have shortness of breath.   You start wheezing.   Symptoms interfere with normal daily activities.  Document Released: 03/07/2001 Document Revised: 06/01/2011 Document Reviewed: 09/16/2008  [employer] Patient Information 2012 [employer].

## 2011-10-30 ENCOUNTER — Telehealth: Payer: Self-pay | Admitting: Obstetrics and Gynecology

## 2011-10-30 NOTE — Telephone Encounter (Signed)
chandra received °

## 2011-11-01 NOTE — Telephone Encounter (Signed)
Tc to pt. Pt states,"has a rash on buttocks with itching,". Informed pt to fu with pcp for eval. Informed pt may try OTC Hydrocortisone cream or Benadryl for itching. Pt voices understanding.

## 2011-11-07 ENCOUNTER — Other Ambulatory Visit: Payer: Self-pay | Admitting: Occupational Medicine

## 2011-11-07 ENCOUNTER — Ambulatory Visit: Payer: Self-pay

## 2011-11-07 DIAGNOSIS — R52 Pain, unspecified: Secondary | ICD-10-CM

## 2012-01-15 ENCOUNTER — Telehealth: Payer: Self-pay

## 2012-01-15 NOTE — Telephone Encounter (Signed)
RX for SV calcium/mag/zinc x1year rf's faxed to pharm on file per rx request.

## 2012-03-28 ENCOUNTER — Telehealth: Payer: Self-pay | Admitting: Obstetrics and Gynecology

## 2012-03-28 NOTE — Telephone Encounter (Signed)
Tc to pt per telephone call. Pt c/o right side abd pain. No fever. Pt with h/o ovarian cyst. Pt has taken Ibuprofen 800mg  w/o improvement. Appt sched 04/04/12 with EP for eval(pt req to be seen next week). Informed pt to cb if fever occurs or pain increases. Pt agrees.

## 2012-04-01 ENCOUNTER — Ambulatory Visit (INDEPENDENT_AMBULATORY_CARE_PROVIDER_SITE_OTHER): Payer: BC Managed Care – PPO | Admitting: Obstetrics and Gynecology

## 2012-04-01 ENCOUNTER — Encounter: Payer: Self-pay | Admitting: Obstetrics and Gynecology

## 2012-04-01 ENCOUNTER — Telehealth: Payer: Self-pay | Admitting: Obstetrics and Gynecology

## 2012-04-01 VITALS — BP 110/62 | Temp 98.7°F | Ht 63.0 in | Wt 203.0 lb

## 2012-04-01 DIAGNOSIS — I82409 Acute embolism and thrombosis of unspecified deep veins of unspecified lower extremity: Secondary | ICD-10-CM | POA: Insufficient documentation

## 2012-04-01 DIAGNOSIS — D219 Benign neoplasm of connective and other soft tissue, unspecified: Secondary | ICD-10-CM | POA: Insufficient documentation

## 2012-04-01 DIAGNOSIS — N970 Female infertility associated with anovulation: Secondary | ICD-10-CM | POA: Insufficient documentation

## 2012-04-01 DIAGNOSIS — N739 Female pelvic inflammatory disease, unspecified: Secondary | ICD-10-CM | POA: Insufficient documentation

## 2012-04-01 DIAGNOSIS — R300 Dysuria: Secondary | ICD-10-CM

## 2012-04-01 DIAGNOSIS — R102 Pelvic and perineal pain: Secondary | ICD-10-CM | POA: Insufficient documentation

## 2012-04-01 DIAGNOSIS — N949 Unspecified condition associated with female genital organs and menstrual cycle: Secondary | ICD-10-CM

## 2012-04-01 DIAGNOSIS — B009 Herpesviral infection, unspecified: Secondary | ICD-10-CM | POA: Insufficient documentation

## 2012-04-01 DIAGNOSIS — N898 Other specified noninflammatory disorders of vagina: Secondary | ICD-10-CM

## 2012-04-01 LAB — POCT URINALYSIS DIPSTICK
Ketones, UA: NEGATIVE
Nitrite, UA: NEGATIVE
Protein, UA: NEGATIVE
Spec Grav, UA: 1.02
Urobilinogen, UA: NEGATIVE

## 2012-04-01 MED ORDER — TRAMADOL HCL 50 MG PO TABS: ORAL_TABLET | ORAL | Status: DC

## 2012-04-01 MED ORDER — OXYCODONE-ACETAMINOPHEN 5-325 MG PO TABS: ORAL_TABLET | ORAL | Status: DC

## 2012-04-01 NOTE — Telephone Encounter (Signed)
SPOKE WITH PT CONCERNING MESSAGE. PT STATES SHE IS HAVING RIGHT SIDE PAIN AND SHE STATES SHE DID NOT WANT TO WAIT UNTIL THURS FOR IT TO BE EVAL. PT STATES SHE HAVE BEEN SEEING [facility] FOR A LONG TIME AND WOULD LIKE TO HAVE APPT WITH HER,BUT IF NOT SHE IS WILLING TO SEE WHO SHE CAN TO BE EVALUATED FOR THE PAIN. INFORMED PT THAT I WILL CONSULT WITH [facility] AND GIVE PT A CALL BACK. PT VOICED UNDERSTANDING.

## 2012-04-01 NOTE — Progress Notes (Signed)
  GYN PROBLEM VISIT  [name] is a 46 y.o. year old female,G0P0000, who presents for a problem visit. C/o 10 days of pelvic pain similar to that with an ovarian cyst in the past. Some nausea, but no vomiting.  No diarrhea, no constipation. No relief from Ibuprofen.  No dysuria, but gets pain with a full bladder and relief from emptying bladder.  Subjective:  Vag. Discharge:yes YELLOW Odor:no Fever:no Irreg.Periods:HYSTERECTOMY Dyspareunia:yes Dysuria:yes Frequency:no Urgency:no Hematuria:no Kidney stones:no Constipation:no Diarrhea:no Rectal Bleeding: no Vomiting:no Nausea:yes Pregnant:no Fibroids:yes Endometriosis:no Hx of Ovarian Cyst:yes Hx IUD:no Hx STD-PID:no Appendectomy:no Gall Bladder Dz:no   Objective:  BP 110/62  Ht 5\' 3"  (1.6 m)  Wt 203 lb (92.08 kg)  BMI 35.96 kg/m2   GI: soft, non-tender; bowel sounds normal; no masses,  no organomegaly and tenderness to deep palpation in RLQ.  No rebound  External genitalia: normal general appearance Vaginal: normal rugae Cervix: removed surgically Adnexa: tenderness and right adnexal fullness Uterus: removed surgically Rectal: good sphincter tone and no masses Exam limited by body habitus  Assessment:  Hx recurrent ovarian cyst in past with probable current cyst PCOS S/p DVT.  Not a candidate for HRT  Plan: Ultram for daytime pain, Percocet at night prn pain Return to office in 3 week(s). If no improvement will plan U/S at that time  Dierdre Forth, MD  04/01/2012 3:16 PM

## 2012-04-02 NOTE — Addendum Note (Signed)
Addended by: Lerry Liner D on: 04/02/2012 08:37 AM   Modules accepted: Orders

## 2012-04-04 ENCOUNTER — Encounter: Payer: BC Managed Care – PPO | Admitting: Obstetrics and Gynecology

## 2012-04-22 ENCOUNTER — Telehealth: Payer: Self-pay | Admitting: Obstetrics and Gynecology

## 2012-04-22 NOTE — Telephone Encounter (Signed)
Returned pt's call regarding pain. Pt has PCOS. Pt was seen on 04/01/2012, DX with ovarian cyst. Pt is still in extreme pain and need ov sooner than 04/30/2012. Pt was given an appt for 04/24/2012 @ 12:45 with Dr. Pennie Rushing. Mathis Bud

## 2012-04-24 ENCOUNTER — Encounter: Payer: Self-pay | Admitting: Obstetrics and Gynecology

## 2012-04-24 ENCOUNTER — Ambulatory Visit (INDEPENDENT_AMBULATORY_CARE_PROVIDER_SITE_OTHER): Payer: BC Managed Care – PPO

## 2012-04-24 ENCOUNTER — Other Ambulatory Visit: Payer: Self-pay | Admitting: Obstetrics and Gynecology

## 2012-04-24 ENCOUNTER — Ambulatory Visit (INDEPENDENT_AMBULATORY_CARE_PROVIDER_SITE_OTHER): Payer: BC Managed Care – PPO | Admitting: Obstetrics and Gynecology

## 2012-04-24 VITALS — BP 120/80 | Temp 97.9°F | Resp 16 | Ht 62.0 in | Wt 200.0 lb

## 2012-04-24 DIAGNOSIS — N949 Unspecified condition associated with female genital organs and menstrual cycle: Secondary | ICD-10-CM

## 2012-04-24 DIAGNOSIS — Z113 Encounter for screening for infections with a predominantly sexual mode of transmission: Secondary | ICD-10-CM

## 2012-04-24 DIAGNOSIS — R102 Pelvic and perineal pain unspecified side: Secondary | ICD-10-CM

## 2012-04-24 DIAGNOSIS — N898 Other specified noninflammatory disorders of vagina: Secondary | ICD-10-CM

## 2012-04-24 LAB — POCT URINALYSIS DIPSTICK
Bilirubin, UA: NEGATIVE
Blood, UA: NEGATIVE
Ketones, UA: NEGATIVE
Nitrite, UA: NEGATIVE
Spec Grav, UA: 1.01

## 2012-04-24 LAB — POCT WET PREP (WET MOUNT): Clue Cells Wet Prep Whiff POC: NEGATIVE

## 2012-04-24 MED ORDER — TRAMADOL HCL 50 MG PO TABS: ORAL_TABLET | ORAL | Status: DC

## 2012-04-24 MED ORDER — FLUCONAZOLE 150 MG PO TABS: ORAL_TABLET | ORAL | Status: DC

## 2012-04-24 MED ORDER — OXYCODONE-ACETAMINOPHEN 5-325 MG PO TABS: ORAL_TABLET | ORAL | Status: DC

## 2012-04-24 MED ORDER — AMITRIPTYLINE HCL 10 MG PO TABS: 10.0000 mg | ORAL_TABLET | Freq: Every day | ORAL | Status: DC

## 2012-04-24 NOTE — Progress Notes (Signed)
GYN PROBLEM VISIT  [name] is a 46 y.o. year old female,G0P0000, who presents for a problem visit. She has a long hx of pelvic pain and presents today with exacerbation of pain after an episode at the beginning of the month.No GI or GU sx/  Subjective:  Pelvic pain:  Vag. Discharge:no Odor:no Fever:no Irreg.Periods:no Dyspareunia:yes Dysuria:yes Frequency:yes Urgency:no Hematuria:no Kidney stones:no Constipation:no Diarrhea:no Rectal Bleeding: no Vomiting:no Nausea:yes Pregnant:no Fibroids:s/p hyst Endometriosis:no Hx of Ovarian Cyst:yes Hx IUD:no Hx STD-PID:yes PID early 90's Appendectomy:no Gall Bladder Dz:no   Objective:  BP 120/80  Temp 97.9 F (36.6 C) (Oral)  Resp 16  Ht 5\' 2"  (1.575 m)  Wt 200 lb (90.719 kg)  BMI 36.58 kg/m2   GI: soft, non-tender; bowel sounds normal; no masses,  no organomegaly and Exam is limited by body habitus  External genitalia: normal general appearance Vaginal: normal rugae, discharge, white and vaginal vault, well healed and suspended Cervix: removed surgically Adnexa: no masses Uterus: removed surgically Rectal: good sphincter tone and no masses Exam limited by body habitus  ULTRASOUND: TRANSVAGINAL AND TRANSABDOMINAL IMAGES.  Both ovaries well visualized and normal without cyst or other lesions.  No adnexal masses.  No free fluid seen.  Wet Prep:  Yeast Assessment: Recurrent pelvic pain in pt with years of chronic pelvic pain, now s/p hysterectomy Yeast vaginitis   Plan: Diflucan for 3 days Begin amitryptiline10 mg hs Return to office in 3 week(s).   Dierdre Forth, MD  04/24/2012 1:36 PM

## 2012-04-24 NOTE — Addendum Note (Signed)
Addended byWinfred Leeds on: 04/24/2012 05:48 PM   Modules accepted: Orders

## 2012-04-26 LAB — GC/CHLAMYDIA PROBE AMP: GC Probe RNA: NEGATIVE

## 2012-04-29 ENCOUNTER — Encounter: Payer: BC Managed Care – PPO | Admitting: Obstetrics and Gynecology

## 2012-05-28 ENCOUNTER — Encounter: Payer: Self-pay | Admitting: Obstetrics and Gynecology

## 2012-05-28 ENCOUNTER — Ambulatory Visit (INDEPENDENT_AMBULATORY_CARE_PROVIDER_SITE_OTHER): Payer: BC Managed Care – PPO | Admitting: Obstetrics and Gynecology

## 2012-05-28 VITALS — BP 118/78 | HR 76 | Wt 202.0 lb

## 2012-05-28 DIAGNOSIS — R102 Pelvic and perineal pain: Secondary | ICD-10-CM

## 2012-05-28 DIAGNOSIS — N949 Unspecified condition associated with female genital organs and menstrual cycle: Secondary | ICD-10-CM

## 2012-05-28 MED ORDER — FLUCONAZOLE 150 MG PO TABS: ORAL_TABLET | ORAL | Status: DC

## 2012-05-28 NOTE — Progress Notes (Signed)
Subjective:    [name] is a 46 y.o. female, G0P0000, who presents for follow up on pelvic pain from 04/24/2012.  She was unable to tolerate amitriptyline because of sleepiness.  Her pain has improved significantly and she now has only the pain she experiences monthly with ovulation that is responsive to Ultram and is self limited.  She took her Diflucan with good relief of yeast sx.  Vag. Discharge:no  Odor:no  Fever:no  Irreg.Periods:no  Dyspareunia:yes  Dysuria:yes  Frequency:yes  Urgency:no  Hematuria:no  Kidney stones:no  Constipation:no  Diarrhea:no  Rectal Bleeding: no  Vomiting:no  Nausea:yes  Pregnant:no  Fibroids:s/p hyst  Endometriosis:no  Hx of Ovarian Cyst:yes  Hx IUD:no  Hx STD-PID:yes PID early 90's  Appendectomy:no  Gall Bladder Dz:no    The following portions of the patient's history were reviewed and updated as appropriate: allergies, current medications, past family history.  Review of Systems Pertinent items are noted in HPI. Breast:Negative for breast lump,nipple discharge or nipple retraction Gastrointestinal: Negative for abdominal pain, change in bowel habits or rectal bleeding Urinary:negative   Objective:    BP 118/78  Pulse 76  Wt 202 lb (91.627 kg)    Weight:  Wt Readings from Last 1 Encounters:  05/28/12 202 lb (91.627 kg)          BMI: There is no height on file to calculate BMI.  General Appearance: Alert, appropriate appearance for age. No acute distress GYN exam: Bimanual reveals no tenderness or masses   Assessment:    History of chronic pelvic pain with recent acute exacerbation, now improved  Monilia infection, recurrent, now resolved   Plan:    Ultram prn ovulation pain Diflucan prn pain associated with monilia sx If pain persists beyond these interventions will try nortriptyline 5mg  with slow titration to 25 mg  Otherwise followup for aex 4/14 or prn

## 2012-08-16 ENCOUNTER — Other Ambulatory Visit: Payer: Self-pay | Admitting: Obstetrics and Gynecology

## 2012-08-17 ENCOUNTER — Other Ambulatory Visit: Payer: Self-pay | Admitting: Obstetrics and Gynecology

## 2012-08-27 ENCOUNTER — Other Ambulatory Visit: Payer: Self-pay | Admitting: Obstetrics and Gynecology

## 2012-08-28 NOTE — Telephone Encounter (Signed)
[facility] to address.  

## 2012-08-29 NOTE — Telephone Encounter (Signed)
Refill x1 

## 2013-06-26 HISTORY — PX: OTHER SURGICAL HISTORY: SHX169

## 2014-08-15 ENCOUNTER — Encounter (HOSPITAL_COMMUNITY): Payer: Self-pay | Admitting: *Deleted

## 2014-08-15 ENCOUNTER — Emergency Department (HOSPITAL_COMMUNITY)
Admission: EM | Admit: 2014-08-15 | Discharge: 2014-08-15 | Disposition: A | Discharge status: Home / Self Care | Payer: BC Managed Care – PPO | Source: Home / Self Care | Attending: Emergency Medicine | Admitting: Emergency Medicine

## 2014-08-15 DIAGNOSIS — B349 Viral infection, unspecified: Secondary | ICD-10-CM

## 2014-08-15 LAB — POCT RAPID STREP A: STREPTOCOCCUS, GROUP A SCREEN (DIRECT): NEGATIVE

## 2014-08-15 NOTE — Discharge Instructions (Signed)
Viral Infections                  Rest, lots of fluids, Tylenol every 4 hours and/or ibuprofen 600 mg every 6-8 hours. For upper respiratory and lower respiratory symptoms recommend NyQuil or Alka-Seltzer cold plus.  A viral infection can be caused by different types of viruses.Most viral infections are not serious and resolve on their own. However, some infections may cause severe symptoms and may lead to further complications. SYMPTOMS Viruses can frequently cause:  Minor sore throat.  Aches and pains.  Headaches.  Runny nose.  Different types of rashes.  Watery eyes.  Tiredness.  Cough.  Loss of appetite.  Gastrointestinal infections, resulting in nausea, vomiting, and diarrhea. These symptoms do not respond to antibiotics because the infection is not caused by bacteria. However, you might catch a bacterial infection following the viral infection. This is sometimes called a "superinfection." Symptoms of such a bacterial infection may include:  Worsening sore throat with pus and difficulty swallowing.  Swollen neck glands.  Chills and a high or persistent fever.  Severe headache.  Tenderness over the sinuses.  Persistent overall ill feeling (malaise), muscle aches, and tiredness (fatigue).  Persistent cough.  Yellow, green, or brown mucus production with coughing. HOME CARE INSTRUCTIONS   Only take over-the-counter or prescription medicines for pain, discomfort, diarrhea, or fever as directed by your caregiver.  Drink enough water and fluids to keep your urine clear or pale yellow. Sports drinks can provide valuable electrolytes, sugars, and hydration.  Get plenty of rest and maintain proper nutrition. Soups and broths with crackers or rice are fine. SEEK IMMEDIATE MEDICAL CARE IF:   You have severe headaches, shortness of breath, chest pain, neck pain, or an unusual rash.  You have  uncontrolled vomiting, diarrhea, or you are unable to keep down fluids.  You or your child has an oral temperature above 102 F (38.9 C), not controlled by medicine.  Your baby is older than 3 months with a rectal temperature of 102 F (38.9 C) or higher.  Your baby is 31 months old or younger with a rectal temperature of 100.4 F (38 C) or higher. MAKE SURE YOU:   Understand these instructions.  Will watch your condition.  Will get help right away if you are not doing well or get worse. Document Released: 03/22/2005 Document Revised: 09/04/2011 Document Reviewed: 10/17/2010 [facility] Patient Information 2015 [place], [place]. This information is not intended to replace advice given to you by your health care provider. Make sure you discuss any questions you have with your health care provider.

## 2014-08-15 NOTE — ED Notes (Signed)
Pt  Reports   Symptoms  Of    Fever      Cough  Body  Aches      And  Headache        With  Onset  Of  Symptoms  X  Several  Days           Pt      Reports       Cough is  Non  Productive         Pt  Is  Masked  And  Is in  A  Private room         Sitting  Upright on  Exam table  Speaking in  Complete  sentances

## 2014-08-15 NOTE — ED Provider Notes (Signed)
CSN: [id number]     Arrival date & time 08/15/14  1249 History   First MD Initiated Contact with Patient 08/15/14 1348     Chief Complaint  Patient presents with  . URI   (Consider location/radiation/quality/duration/timing/severity/associated sxs/prior Treatment) HPI Comments: 49 year old female is complaining of a cough, headache, chest congestion and nasal congestion for 2 days. She is also had a fever with a temperature of 101.6 in the urgent care. She denies having body aches or myalgias to me. Denies shortness of breath. The only chest pain that she states is that which occurs when taking a deep breath or coughing. She is not taking any medications for the symptoms. Past medical history includes PCO S, chronic female pelvic pain, DVT twice, fibroids, HSV 2, ASCUS, PID, pelvic adhesions.   Past Medical History  Diagnosis Date  . PCOS (polycystic ovarian syndrome)   . History of PID [place]  . History of DVT (deep vein thrombosis) 2004  . Chronic female pelvic pain   . History of ovarian cyst   . Pelvic adhesions     HISTORY OF  . Contusion, breast   . Anxiety   . PID (pelvic inflammatory disease)   . ASCUS (atypical squamous cells of undetermined significance) on Pap smear   . Infertility associated with anovulation   . Fibroids   . HSV-2 infection   . DVT (deep venous thrombosis)    Past Surgical History  Procedure Laterality Date  . Abdominal hysterectomy    . Breast surgery  2004    BREAST REDUCTION  . Carpal tunnel release  2009     RIGHT WRIST  . Carpal tunnel release  2010    LEFT WRIST   Family History  Problem Relation Age of Onset  . Cancer Paternal Grandmother 16    OVARIAN  . Hypertension Mother   . Hypertension Father   . Diabetes Maternal Aunt   . Cancer Paternal Aunt 19    CERVICAL  . Stroke Maternal Grandmother    History  Substance Use Topics  . Smoking status: Never Smoker   . Smokeless tobacco: Never Used  . Alcohol Use: No   OB  History    Gravida Para Term Preterm AB TAB SAB Ectopic Multiple Living   0 0 0 0 0 0 0 0 0 0      Review of Systems  Constitutional: Positive for fever, activity change, appetite change and fatigue.  HENT: Positive for congestion and sore throat. Negative for ear discharge, ear pain, postnasal drip, sinus pressure, trouble swallowing and voice change.   Respiratory: Positive for cough. Negative for shortness of breath and wheezing.   Cardiovascular:       As per history of present illness No heaviness, tightness, pressure, or fullness. No pain in the neck, jaw or arm.  Gastrointestinal: Negative.   Genitourinary: Negative.   Musculoskeletal: Negative.  Negative for myalgias and neck pain.  Neurological: Negative.     Allergies  Review of patient's allergies indicates no known allergies.  Home Medications   Prior to Admission medications   Medication Sig Start Date End Date Taking? Authorizing Provider  amitriptyline (ELAVIL) 10 MG tablet Take 1 tablet (10 mg total) by mouth at bedtime. 04/24/12   Eldred Manges, MD  CALCIUM-MAGNESIUM-ZINC PO Take 1 tablet by mouth daily.    Historical Provider, MD  cholecalciferol (VITAMIN D) 1000 UNITS tablet Take 1,000 Units by mouth daily.    Historical Provider, MD  Cobalamine Combinations (FOLTRATE  PO) Take 1 tablet by mouth daily.    Historical Provider, MD  fluconazole (DIFLUCAN) 150 MG tablet Pt to take 1 tablet by mouth every other day x 3 days 04/24/12   Eldred Manges, MD  fluconazole (DIFLUCAN) 150 MG tablet Pt to take 1 tablet by mouth prn 05/28/12   Eldred Manges, MD  loratadine (CLARITIN) 10 MG tablet Take 10 mg by mouth daily.    Historical Provider, MD  losartan (COZAAR) 50 MG tablet Take 100 mg by mouth daily.     Historical Provider, MD  traMADol (ULTRAM) 50 MG tablet Pt to take 1-2 tablets by mouth every 4 hours prn pain 04/24/12   Eldred Manges, MD  traMADol (ULTRAM) 50 MG tablet TAKE 1 TO 2 TABLETS BY MOUTH EVERY 4  HOURS AS NEEDED FOR PAIN 08/27/12   Eldred Manges, MD   BP 138/88 mmHg  Pulse 113  Temp(Src) 101.6 F (38.7 C) (Oral)  Resp 18  SpO2 97% Physical Exam  Constitutional: She is oriented to person, place, and time. She appears well-developed and well-nourished. No distress.  HENT:  Bilateral TMs are normal Oropharynx with erythema and scant clear PND. Airway is widely patent. No exudates.  Eyes: Conjunctivae and EOM are normal.  Neck: Normal range of motion. Neck supple.  Cardiovascular: Normal rate, regular rhythm, normal heart sounds and intact distal pulses.   Pulmonary/Chest: Effort normal and breath sounds normal. No respiratory distress. She has no wheezes. She has no rales.  Abdominal: Soft. There is no tenderness.  Musculoskeletal: Normal range of motion. She exhibits no edema.  Lymphadenopathy:    She has no cervical adenopathy.  Neurological: She is alert and oriented to person, place, and time. No cranial nerve deficit. She exhibits normal muscle tone.  Skin: Skin is warm and dry.  Psychiatric: She has a normal mood and affect.  Nursing note and vitals reviewed.   ED Course  Procedures (including critical care time) Labs Review Labs Reviewed  POCT RAPID STREP A ([place])   Results for orders placed or performed during the hospital encounter of 08/15/14  POCT rapid strep A [facility])  Result Value Ref Range   Streptococcus, Group A Screen (Direct) NEGATIVE NEGATIVE    Imaging Review No results found.   MDM   1. Acute viral syndrome    Continue your Imetrex at home Rest , drink plenty of fluids. Call your doctor Monday for problems or continued recurring headaches.    Janne Napoleon, NP 08/15/14 [phone]

## 2014-08-17 LAB — CULTURE, GROUP A STREP: STREP A CULTURE: NEGATIVE

## 2014-11-24 ENCOUNTER — Other Ambulatory Visit: Payer: Self-pay | Admitting: Occupational Medicine

## 2014-11-24 ENCOUNTER — Ambulatory Visit: Payer: Self-pay

## 2014-11-24 DIAGNOSIS — M25571 Pain in right ankle and joints of right foot: Secondary | ICD-10-CM

## 2014-11-26 ENCOUNTER — Other Ambulatory Visit: Payer: Self-pay | Admitting: Family

## 2014-11-26 DIAGNOSIS — M79605 Pain in left leg: Secondary | ICD-10-CM

## 2014-12-11 ENCOUNTER — Other Ambulatory Visit: Payer: Self-pay

## 2014-12-18 ENCOUNTER — Other Ambulatory Visit: Payer: Self-pay

## 2014-12-21 ENCOUNTER — Other Ambulatory Visit: Payer: Self-pay | Admitting: Obstetrics and Gynecology

## 2015-01-08 ENCOUNTER — Inpatient Hospital Stay (HOSPITAL_COMMUNITY): Admission: RE | Admit: 2015-01-08 | Payer: Self-pay | Source: Ambulatory Visit

## 2015-01-08 ENCOUNTER — Encounter (HOSPITAL_COMMUNITY): Payer: Self-pay

## 2015-01-08 ENCOUNTER — Other Ambulatory Visit: Payer: Self-pay

## 2015-01-08 ENCOUNTER — Encounter (HOSPITAL_COMMUNITY)
Admission: RE | Admit: 2015-01-08 | Discharge: 2015-01-08 | Disposition: A | Discharge status: Home / Self Care | Payer: BC Managed Care – PPO | Source: Ambulatory Visit | Attending: Obstetrics and Gynecology | Admitting: Obstetrics and Gynecology

## 2015-01-08 DIAGNOSIS — N8320 Unspecified ovarian cysts: Secondary | ICD-10-CM | POA: Diagnosis not present

## 2015-01-08 DIAGNOSIS — R102 Pelvic and perineal pain: Secondary | ICD-10-CM | POA: Insufficient documentation

## 2015-01-08 DIAGNOSIS — Z01818 Encounter for other preprocedural examination: Secondary | ICD-10-CM | POA: Insufficient documentation

## 2015-01-08 HISTORY — DX: Essential (primary) hypertension: I10

## 2015-01-08 LAB — COMPREHENSIVE METABOLIC PANEL
ALBUMIN: 3.6 g/dL (ref 3.5–5.0)
ALK PHOS: 59 U/L (ref 38–126)
ALT: 18 U/L (ref 14–54)
ANION GAP: 6 (ref 5–15)
AST: 15 U/L (ref 15–41)
BUN: 11 mg/dL (ref 6–20)
CALCIUM: 9 mg/dL (ref 8.9–10.3)
CHLORIDE: 103 mmol/L (ref 101–111)
CO2: 28 mmol/L (ref 22–32)
Creatinine, Ser: 0.84 mg/dL (ref 0.44–1.00)
GFR calc Af Amer: >60 mL/min (ref >60)
GFR calc non Af Amer: >60 mL/min (ref >60)
GLUCOSE: 80 mg/dL (ref 65–99)
Potassium: 3.4 mmol/L — ABNORMAL LOW (ref 3.5–5.1)
Sodium: 137 mmol/L (ref 135–145)
TOTAL PROTEIN: 7.4 g/dL (ref 6.5–8.1)
Total Bilirubin: 0.6 mg/dL (ref 0.3–1.2)

## 2015-01-08 LAB — CBC
HEMATOCRIT: 41.4 % (ref 36.0–46.0)
Hemoglobin: 14 g/dL (ref 12.0–15.0)
MCH: 32.3 pg (ref 26.0–34.0)
MCHC: 33.8 g/dL (ref 30.0–36.0)
MCV: 95.4 fL (ref 78.0–100.0)
PLATELETS: 323 10*3/uL (ref 150–400)
RBC: 4.34 MIL/uL (ref 3.87–5.11)
RDW: 12.9 % (ref 11.5–15.5)
WBC: 5.9 10*3/uL (ref 4.0–10.5)

## 2015-01-08 NOTE — Patient Instructions (Addendum)
Your procedure is scheduled on:  January 12, 2015  Enter through the Main Entrance of [facility] at:  8:15 am   Pick up the phone at the desk and dial [phone].  Call this number if you have problems the morning of surgery: [phone].  Remember: Do NOT eat food: after midnight on Monday  Do NOT drink clear liquids after: midnight on Monday  Take these medicines the morning of surgery with a SIP OF WATER:  Hyzaar   Bring inhaler day of surgery   Do NOT wear jewelry (body piercing), metal hair clips/bobby pins, make-up, or nail polish. Do NOT wear lotions, powders, or perfumes.  You may wear deoderant. Do NOT shave for 48 hours prior to surgery. Do NOT bring valuables to the hospital. Contacts, dentures, or bridgework may not be worn into surgery. Have a responsible adult drive you home and stay with you for 24 hours after your procedure

## 2015-01-10 ENCOUNTER — Other Ambulatory Visit (HOSPITAL_COMMUNITY): Payer: Self-pay | Admitting: Obstetrics and Gynecology

## 2015-01-10 NOTE — H&P (Signed)
[name] is a  49 yo female  S/P hysterectomy, G0 presents for laparoscopic removal of her left ovary because of chronic pelvic pain and a persistent left ovarian cyst.  Over the past several  Years,  the patient has had intermittent pelvic pain,  that in October 2015 began to increase in intensity. The patient noticed more intense dull, achy and burning pain with ovulation,  that she rated as 10/10 on a 10 point pain scale.  Over the counter analgesia had no affect on her pain but she did find significant relied with Tramadol.  She admits to nausea, dyspareunia,  increase in pain with prolong standing, pelvic discomfort when her bladder is full and with urination.  She denies any changes in bowel function or lower back pain. A pelvic ultrasound in July 2016 showed a surgically absent uterus:  right ovary: 4.13 x 3.76 x 2.80 cm and left ovary: 5.71 x 5.18 x 4.02 cm and a complex cyst-4.3 x 3.6 x 4.1 cm; adjacent to this left ovary  was a tubular structure consistent with a hydrosalpinx.   A CA-125, CBC and CMET were normal except for potassium = 3.4.  Given the persistent nature of her ovarian cyst and the protracted and debilitating  nature of her pelvic pain,  the patient has chosen to proceed with surgical management.   Past Medical History  OB History: G:0;    GYN History: menarche: 49 YO    LMP; Hysterectomy   Has a remote history an abnormal PAP with no treatment and a history of HSV-2.  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 Doxycycline Nausea And Vomiting  . Hydrocodone Itching  . Percocet [Oxycodone-Acetaminophen] Itching  . Iodine Rash    Contraindicated due to shellfish allergy.  . Shellfish Allergy Rash    ROS: Admits to glasses and pelvic pain with voiding but  denies headache, vision changes, nasal congestion, dysphagia, tinnitus, dizziness, hoarseness, cough,  chest pain, shortness of breath, nausea, vomiting, diarrhea,constipation,  urinary frequency, urgency hematuria, vaginitis symptoms, swelling of joints,easy bruising,  myalgias, arthralgias, skin rashes, unexplained weight loss and except as is mentioned in the history of present illness, patient's review of systems is otherwise negative.   Physical Exam  Bp: 122/72   P: 80     R: 16  Temperature: 98.9 degrees F orally  Weight: 198.5 lbs.  Height: 5' 2.5 "  BMI:  35.7  Neck: supple without masses or thyromegaly Lungs: clear to auscultation Heart: regular rate and rhythm Abdomen: soft, diffusely tender and no organomegaly Pelvic:EGBUS- wnl; vagina-normal rugae; uterus/cervix-surgically absent; adnexae-bilateral  tenderness left greater than right but no palpable masses Extremities:  no clubbing, cyanosis or edema   Assesment: Persistent Left Ovarian Cyst  Chronic Pelvic Pain   Disposition:  A discussion was held with patient regarding the indication for her procedure(s) along with the risks, which include but are not limited to: reaction to anesthesia, damage to adjacent organs, infection,  excessive bleeding and possible need for an open abdominal incision. The patient verbalized understanding of these  risks and has consented to proceed with Laparoscopic Left Salpingo-oophorectomy, Possible Laparotomy, Possible Right Salpingo-oophorectomy  at [facility] on January 12, 2015 at 10:45 a.m.   CSN# [id number]   Izek Corvino J. Florene Glen, PA-C  for Dr. Seymour Bars. Haygood

## 2015-01-12 ENCOUNTER — Encounter (HOSPITAL_COMMUNITY)
Admission: AD | Disposition: A | Discharge status: Home / Self Care | Payer: Self-pay | Source: Ambulatory Visit | Attending: Obstetrics and Gynecology

## 2015-01-12 ENCOUNTER — Inpatient Hospital Stay (HOSPITAL_COMMUNITY)
Admission: AD | Admit: 2015-01-12 | Discharge: 2015-01-14 | DRG: 743 | Disposition: A | Discharge status: Home / Self Care | Payer: BC Managed Care – PPO | Source: Ambulatory Visit | Attending: Obstetrics and Gynecology | Admitting: Obstetrics and Gynecology

## 2015-01-12 ENCOUNTER — Ambulatory Visit (HOSPITAL_COMMUNITY): Payer: BC Managed Care – PPO | Admitting: Anesthesiology

## 2015-01-12 ENCOUNTER — Encounter (HOSPITAL_COMMUNITY): Payer: Self-pay | Admitting: Anesthesiology

## 2015-01-12 DIAGNOSIS — N736 Female pelvic peritoneal adhesions (postinfective): Secondary | ICD-10-CM | POA: Diagnosis present

## 2015-01-12 DIAGNOSIS — I10 Essential (primary) hypertension: Secondary | ICD-10-CM | POA: Diagnosis present

## 2015-01-12 DIAGNOSIS — J45909 Unspecified asthma, uncomplicated: Secondary | ICD-10-CM | POA: Diagnosis present

## 2015-01-12 DIAGNOSIS — R102 Pelvic and perineal pain: Secondary | ICD-10-CM | POA: Diagnosis present

## 2015-01-12 DIAGNOSIS — Y658 Other specified misadventures during surgical and medical care: Secondary | ICD-10-CM | POA: Diagnosis present

## 2015-01-12 DIAGNOSIS — N83209 Unspecified ovarian cyst, unspecified side: Secondary | ICD-10-CM | POA: Diagnosis present

## 2015-01-12 DIAGNOSIS — Z90710 Acquired absence of both cervix and uterus: Secondary | ICD-10-CM

## 2015-01-12 DIAGNOSIS — N8320 Unspecified ovarian cysts: Principal | ICD-10-CM | POA: Diagnosis present

## 2015-01-12 DIAGNOSIS — Z86718 Personal history of other venous thrombosis and embolism: Secondary | ICD-10-CM | POA: Diagnosis not present

## 2015-01-12 HISTORY — PX: OOPHORECTOMY: SHX6387

## 2015-01-12 HISTORY — PX: LAPAROTOMY: SHX154

## 2015-01-12 HISTORY — PX: LAPAROSCOPIC SALPINGO OOPHERECTOMY: SHX5927

## 2015-01-12 SURGERY — SALPINGO-OOPHORECTOMY, LAPAROSCOPIC
Anesthesia: General | Site: Abdomen

## 2015-01-12 MED ORDER — MIDAZOLAM HCL 2 MG/2ML IJ SOLN
INTRAMUSCULAR | Status: AC
  Filled 2015-01-12: qty 2

## 2015-01-12 MED ORDER — SCOPOLAMINE 1 MG/3DAYS TD PT72
MEDICATED_PATCH | TRANSDERMAL | Status: AC
  Administered 2015-01-12: 1.5 mg via TRANSDERMAL
  Filled 2015-01-12: qty 1

## 2015-01-12 MED ORDER — GLYCOPYRROLATE 0.2 MG/ML IJ SOLN
INTRAMUSCULAR | Status: DC | PRN
  Administered 2015-01-12: .6 mg via INTRAVENOUS

## 2015-01-12 MED ORDER — HYDROMORPHONE 0.3 MG/ML IV SOLN
INTRAVENOUS | Status: DC
  Administered 2015-01-12: 0.3 mg via INTRAVENOUS
  Administered 2015-01-12: 19:00:00 via INTRAVENOUS
  Administered 2015-01-13: 2 mL via INTRAVENOUS
  Administered 2015-01-13: 0.6 mg via INTRAVENOUS
  Administered 2015-01-13: 2.4 mg via INTRAVENOUS
  Filled 2015-01-12: qty 25

## 2015-01-12 MED ORDER — EPHEDRINE SULFATE 50 MG/ML IJ SOLN
INTRAMUSCULAR | Status: DC | PRN
  Administered 2015-01-12: 10 mg via INTRAVENOUS
  Administered 2015-01-12 (×2): 5 mg via INTRAVENOUS
  Administered 2015-01-12: 10 mg via INTRAVENOUS
  Administered 2015-01-12: 15 mg via INTRAVENOUS

## 2015-01-12 MED ORDER — MIDAZOLAM HCL 2 MG/2ML IJ SOLN
INTRAMUSCULAR | Status: DC | PRN
  Administered 2015-01-12: 2 mg via INTRAVENOUS

## 2015-01-12 MED ORDER — LACTATED RINGERS IV SOLN
INTRAVENOUS | Status: DC
  Administered 2015-01-12 (×5): via INTRAVENOUS

## 2015-01-12 MED ORDER — ALBUTEROL SULFATE HFA 108 (90 BASE) MCG/ACT IN AERS: INHALATION_SPRAY | RESPIRATORY_TRACT | Status: DC | PRN

## 2015-01-12 MED ORDER — KETOROLAC TROMETHAMINE 30 MG/ML IJ SOLN
INTRAMUSCULAR | Status: AC
  Filled 2015-01-12: qty 1

## 2015-01-12 MED ORDER — LACTATED RINGERS IV SOLN
INTRAVENOUS | Status: DC
  Administered 2015-01-12 – 2015-01-13 (×2): via INTRAVENOUS
  Filled 2015-01-12 (×3): qty 1000

## 2015-01-12 MED ORDER — CEFAZOLIN SODIUM-DEXTROSE 2-3 GM-% IV SOLR
INTRAVENOUS | Status: AC
  Filled 2015-01-12: qty 50

## 2015-01-12 MED ORDER — ONDANSETRON HCL 4 MG/2ML IJ SOLN: 4.0000 mg | Freq: Four times a day (QID) | INTRAMUSCULAR | Status: DC | PRN

## 2015-01-12 MED ORDER — BUPIVACAINE HCL (PF) 0.25 % IJ SOLN
INTRAMUSCULAR | Status: AC
  Filled 2015-01-12: qty 30

## 2015-01-12 MED ORDER — MEPERIDINE HCL 25 MG/ML IJ SOLN: 6.2500 mg | INTRAMUSCULAR | Status: DC | PRN

## 2015-01-12 MED ORDER — ONDANSETRON HCL 4 MG/2ML IJ SOLN
INTRAMUSCULAR | Status: AC
  Filled 2015-01-12: qty 2

## 2015-01-12 MED ORDER — FENTANYL CITRATE (PF) 250 MCG/5ML IJ SOLN
INTRAMUSCULAR | Status: AC
  Filled 2015-01-12: qty 5

## 2015-01-12 MED ORDER — ROCURONIUM BROMIDE 100 MG/10ML IV SOLN
INTRAVENOUS | Status: AC
  Filled 2015-01-12: qty 1

## 2015-01-12 MED ORDER — TRAMADOL HCL 50 MG PO TABS
50.0000 mg | ORAL_TABLET | ORAL | Status: DC | PRN
  Administered 2015-01-13 (×2): 50 mg via ORAL
  Administered 2015-01-13 – 2015-01-14 (×3): 100 mg via ORAL
  Filled 2015-01-12 (×2): qty 1
  Filled 2015-01-12 (×3): qty 2

## 2015-01-12 MED ORDER — SCOPOLAMINE 1 MG/3DAYS TD PT72
1.0000 | MEDICATED_PATCH | Freq: Once | TRANSDERMAL | Status: DC
  Administered 2015-01-12: 1.5 mg via TRANSDERMAL

## 2015-01-12 MED ORDER — ACETAMINOPHEN 10 MG/ML IV SOLN
1000.0000 mg | Freq: Once | INTRAVENOUS | Status: AC
  Administered 2015-01-12: 1000 mg via INTRAVENOUS
  Filled 2015-01-12: qty 100

## 2015-01-12 MED ORDER — BUPIVACAINE HCL (PF) 0.25 % IJ SOLN
INTRAMUSCULAR | Status: DC | PRN
  Administered 2015-01-12 (×2): 10 mL

## 2015-01-12 MED ORDER — ENOXAPARIN SODIUM 40 MG/0.4ML ~~LOC~~ SOLN: 40.0000 mg | SUBCUTANEOUS | Status: DC

## 2015-01-12 MED ORDER — KETOROLAC TROMETHAMINE 30 MG/ML IJ SOLN: 30.0000 mg | Freq: Once | INTRAMUSCULAR | Status: DC | PRN

## 2015-01-12 MED ORDER — HYDROMORPHONE HCL 1 MG/ML IJ SOLN
INTRAMUSCULAR | Status: AC
  Filled 2015-01-12: qty 1

## 2015-01-12 MED ORDER — DEXAMETHASONE SODIUM PHOSPHATE 4 MG/ML IJ SOLN
INTRAMUSCULAR | Status: AC
  Filled 2015-01-12: qty 1

## 2015-01-12 MED ORDER — FENTANYL CITRATE (PF) 100 MCG/2ML IJ SOLN
INTRAMUSCULAR | Status: DC | PRN
  Administered 2015-01-12 (×12): 50 ug via INTRAVENOUS

## 2015-01-12 MED ORDER — PROPOFOL 10 MG/ML IV BOLUS
INTRAVENOUS | Status: DC | PRN
  Administered 2015-01-12: 180 mg via INTRAVENOUS
  Administered 2015-01-12: 20 mg via INTRAVENOUS

## 2015-01-12 MED ORDER — LIDOCAINE HCL (CARDIAC) 20 MG/ML IV SOLN
INTRAVENOUS | Status: AC
Start: 2015-01-12 — End: 2015-01-12
  Filled 2015-01-12: qty 5

## 2015-01-12 MED ORDER — ENOXAPARIN (LOVENOX) PATIENT EDUCATION KIT
PACK | Freq: Once | Status: DC
  Filled 2015-01-12: qty 1

## 2015-01-12 MED ORDER — PROPOFOL 10 MG/ML IV BOLUS
INTRAVENOUS | Status: AC
  Filled 2015-01-12: qty 20

## 2015-01-12 MED ORDER — FENTANYL CITRATE (PF) 100 MCG/2ML IJ SOLN
INTRAMUSCULAR | Status: AC
  Filled 2015-01-12: qty 2

## 2015-01-12 MED ORDER — DEXAMETHASONE SODIUM PHOSPHATE 10 MG/ML IJ SOLN
INTRAMUSCULAR | Status: DC | PRN
  Administered 2015-01-12: 4 mg via INTRAVENOUS

## 2015-01-12 MED ORDER — HYDROMORPHONE HCL 1 MG/ML IJ SOLN
INTRAMUSCULAR | Status: DC | PRN
  Administered 2015-01-12 (×2): 0.5 mg via INTRAVENOUS
  Administered 2015-01-12: 1 mg via INTRAVENOUS

## 2015-01-12 MED ORDER — LOSARTAN POTASSIUM-HCTZ 100-25 MG PO TABS: 1.0000 | ORAL_TABLET | Freq: Every day | ORAL | Status: DC

## 2015-01-12 MED ORDER — ENOXAPARIN SODIUM 40 MG/0.4ML ~~LOC~~ SOLN
40.0000 mg | SUBCUTANEOUS | Status: AC
  Administered 2015-01-12: 40 mg via SUBCUTANEOUS
  Filled 2015-01-12: qty 0.4

## 2015-01-12 MED ORDER — DEXAMETHASONE SODIUM PHOSPHATE 10 MG/ML IJ SOLN
10.0000 mg | Freq: Once | INTRAMUSCULAR | Status: AC
  Administered 2015-01-12: 10 mg via INTRAVENOUS
  Filled 2015-01-12: qty 1

## 2015-01-12 MED ORDER — HYDROCHLOROTHIAZIDE 25 MG PO TABS
25.0000 mg | ORAL_TABLET | Freq: Every day | ORAL | Status: DC
  Administered 2015-01-13 – 2015-01-14 (×2): 25 mg via ORAL
  Filled 2015-01-12 (×2): qty 1

## 2015-01-12 MED ORDER — ROCURONIUM BROMIDE 100 MG/10ML IV SOLN
INTRAVENOUS | Status: DC | PRN
  Administered 2015-01-12 (×2): 10 mg via INTRAVENOUS
  Administered 2015-01-12: 40 mg via INTRAVENOUS
  Administered 2015-01-12 (×2): 10 mg via INTRAVENOUS
  Administered 2015-01-12: 20 mg via INTRAVENOUS
  Administered 2015-01-12: 10 mg via INTRAVENOUS

## 2015-01-12 MED ORDER — EPHEDRINE 5 MG/ML INJ
INTRAVENOUS | Status: AC
  Filled 2015-01-12: qty 10

## 2015-01-12 MED ORDER — ENOXAPARIN SODIUM 40 MG/0.4ML ~~LOC~~ SOLN
40.0000 mg | SUBCUTANEOUS | Status: DC
  Administered 2015-01-13 – 2015-01-14 (×2): 40 mg via SUBCUTANEOUS
  Filled 2015-01-12 (×2): qty 0.4

## 2015-01-12 MED ORDER — ONDANSETRON HCL 4 MG/2ML IJ SOLN
INTRAMUSCULAR | Status: DC | PRN
  Administered 2015-01-12 (×2): 4 mg via INTRAVENOUS

## 2015-01-12 MED ORDER — FENTANYL CITRATE (PF) 100 MCG/2ML IJ SOLN: 25.0000 ug | INTRAMUSCULAR | Status: DC | PRN

## 2015-01-12 MED ORDER — NEOSTIGMINE METHYLSULFATE 10 MG/10ML IV SOLN
INTRAVENOUS | Status: AC
  Filled 2015-01-12: qty 1

## 2015-01-12 MED ORDER — LIDOCAINE HCL (CARDIAC) 20 MG/ML IV SOLN
INTRAVENOUS | Status: DC | PRN
  Administered 2015-01-12: 30 mg via INTRAVENOUS
  Administered 2015-01-12: 70 mg via INTRAVENOUS

## 2015-01-12 MED ORDER — NALOXONE HCL 0.4 MG/ML IJ SOLN: 0.4000 mg | INTRAMUSCULAR | Status: DC | PRN

## 2015-01-12 MED ORDER — SODIUM CHLORIDE 0.9 % IJ SOLN: 9.0000 mL | INTRAMUSCULAR | Status: DC | PRN

## 2015-01-12 MED ORDER — NEOSTIGMINE METHYLSULFATE 10 MG/10ML IV SOLN
INTRAVENOUS | Status: DC | PRN
  Administered 2015-01-12: 4 mg via INTRAVENOUS

## 2015-01-12 MED ORDER — ALBUTEROL SULFATE (2.5 MG/3ML) 0.083% IN NEBU: 3.0000 mL | INHALATION_SOLUTION | Freq: Four times a day (QID) | RESPIRATORY_TRACT | Status: DC | PRN

## 2015-01-12 MED ORDER — GLYCOPYRROLATE 0.2 MG/ML IJ SOLN
INTRAMUSCULAR | Status: AC
  Filled 2015-01-12: qty 3

## 2015-01-12 MED ORDER — CEFAZOLIN SODIUM-DEXTROSE 2-3 GM-% IV SOLR
INTRAVENOUS | Status: DC | PRN
  Administered 2015-01-12 (×2): 2 g via INTRAVENOUS

## 2015-01-12 MED ORDER — DIPHENHYDRAMINE HCL 50 MG/ML IJ SOLN: 12.5000 mg | Freq: Four times a day (QID) | INTRAMUSCULAR | Status: DC | PRN

## 2015-01-12 MED ORDER — LOSARTAN POTASSIUM 50 MG PO TABS
100.0000 mg | ORAL_TABLET | Freq: Every day | ORAL | Status: DC
  Administered 2015-01-13 – 2015-01-14 (×2): 100 mg via ORAL
  Filled 2015-01-12 (×2): qty 2

## 2015-01-12 MED ORDER — PHENYLEPHRINE HCL 10 MG/ML IJ SOLN
INTRAMUSCULAR | Status: DC | PRN
  Administered 2015-01-12: 40 ug via INTRAVENOUS
  Administered 2015-01-12: 80 ug via INTRAVENOUS

## 2015-01-12 MED ORDER — PHENYLEPHRINE 40 MCG/ML (10ML) SYRINGE FOR IV PUSH (FOR BLOOD PRESSURE SUPPORT)
PREFILLED_SYRINGE | INTRAVENOUS | Status: AC
  Filled 2015-01-12: qty 10

## 2015-01-12 MED ORDER — ONDANSETRON HCL 4 MG/2ML IJ SOLN
4.0000 mg | Freq: Once | INTRAMUSCULAR | Status: DC | PRN
Start: 2015-01-12 — End: 2015-01-12

## 2015-01-12 MED ORDER — DIPHENHYDRAMINE HCL 12.5 MG/5ML PO ELIX: 12.5000 mg | ORAL_SOLUTION | Freq: Four times a day (QID) | ORAL | Status: DC | PRN

## 2015-01-12 SURGICAL SUPPLY — 66 items
BARRIER ADHS 3X4 INTERCEED (GAUZE/BANDAGES/DRESSINGS) IMPLANT
BLADE SURG 10 STRL SS (BLADE) ×8 IMPLANT
CABLE HIGH FREQUENCY MONO STRZ (ELECTRODE) IMPLANT
CANISTER SUCT 3000ML (MISCELLANEOUS) ×4 IMPLANT
CHLORAPREP W/TINT 26ML (MISCELLANEOUS) ×4 IMPLANT
CLOSURE WOUND 1/4 X3 (GAUZE/BANDAGES/DRESSINGS)
CLOTH BEACON ORANGE TIMEOUT ST (SAFETY) ×4 IMPLANT
CONTAINER PREFILL 10% NBF 15ML (MISCELLANEOUS) IMPLANT
CONTAINER PREFILL 10% NBF 60ML (FORM) IMPLANT
DECANTER SPIKE VIAL GLASS SM (MISCELLANEOUS) IMPLANT
DISSECTOR SPONGE CHERRY (GAUZE/BANDAGES/DRESSINGS) ×4 IMPLANT
DRAPE WARM FLUID 44X44 (DRAPE) ×4 IMPLANT
DRSG COVADERM PLUS 2X2 (GAUZE/BANDAGES/DRESSINGS) IMPLANT
DRSG OPSITE POSTOP 3X4 (GAUZE/BANDAGES/DRESSINGS) IMPLANT
DRSG OPSITE POSTOP 4X10 (GAUZE/BANDAGES/DRESSINGS) ×4 IMPLANT
ELECT CAUTERY BLADE 6.4 (BLADE) IMPLANT
ELECT NEEDLE TIP 2.8 STRL (NEEDLE) ×4 IMPLANT
GAUZE SPONGE 4X4 16PLY XRAY LF (GAUZE/BANDAGES/DRESSINGS) ×8 IMPLANT
GLOVE SURG SS PI 6.5 STRL IVOR (GLOVE) ×8 IMPLANT
GOWN STRL REUS W/TWL LRG LVL3 (GOWN DISPOSABLE) ×12 IMPLANT
LIQUID BAND (GAUZE/BANDAGES/DRESSINGS) ×8 IMPLANT
NEEDLE HYPO 22GX1.5 SAFETY (NEEDLE) ×4 IMPLANT
NS IRRIG 1000ML POUR BTL (IV SOLUTION) ×8 IMPLANT
PACK ABDOMINAL GYN (CUSTOM PROCEDURE TRAY) IMPLANT
PACK LAPAROSCOPY BASIN (CUSTOM PROCEDURE TRAY) ×4 IMPLANT
PAD OB MATERNITY 4.3X12.25 (PERSONAL CARE ITEMS) ×4 IMPLANT
PAD POSITIONER PINK NONSTERILE (MISCELLANEOUS) ×4 IMPLANT
PENCIL BUTTON HOLSTER BLD 10FT (ELECTRODE) ×4 IMPLANT
POUCH SPECIMEN RETRIEVAL 10MM (ENDOMECHANICALS) IMPLANT
SET IRRIG TUBING LAPAROSCOPIC (IRRIGATION / IRRIGATOR) IMPLANT
SHEARS HARMONIC ACE PLUS 36CM (ENDOMECHANICALS) IMPLANT
SPONGE LAP 18X18 X RAY DECT (DISPOSABLE) ×12 IMPLANT
STAPLER VISISTAT 35W (STAPLE) IMPLANT
STRIP CLOSURE SKIN 1/4X3 (GAUZE/BANDAGES/DRESSINGS) IMPLANT
SUT CHROMIC 3 0 SH 27 (SUTURE) IMPLANT
SUT MNCRL AB 3-0 PS2 27 (SUTURE) ×8 IMPLANT
SUT MNCRL AB 4-0 PS2 18 (SUTURE) IMPLANT
SUT PDS AB 0 CT 36 (SUTURE) IMPLANT
SUT PLAIN 2 0 XLH (SUTURE) IMPLANT
SUT SILK 3 0 SH 30 (SUTURE) ×4 IMPLANT
SUT SILK 3 0 SH CR/8 (SUTURE) ×4 IMPLANT
SUT VIC AB 0 CT1 18XCR BRD8 (SUTURE) IMPLANT
SUT VIC AB 0 CT1 27 (SUTURE) ×6
SUT VIC AB 0 CT1 27XBRD ANBCTR (SUTURE) ×6 IMPLANT
SUT VIC AB 0 CT1 8-18 (SUTURE)
SUT VIC AB 2-0 CT1 27 (SUTURE) ×2
SUT VIC AB 2-0 CT1 TAPERPNT 27 (SUTURE) ×2 IMPLANT
SUT VIC AB 3-0 CT1 27 (SUTURE) ×2
SUT VIC AB 3-0 CT1 TAPERPNT 27 (SUTURE) ×2 IMPLANT
SUT VIC AB 3-0 PS2 18 (SUTURE)
SUT VIC AB 3-0 PS2 18XBRD (SUTURE) IMPLANT
SUT VIC AB 3-0 SH 27 (SUTURE) ×2
SUT VIC AB 3-0 SH 27X BRD (SUTURE) ×2 IMPLANT
SUT VICRYL 0 ENDOLOOP (SUTURE) IMPLANT
SUT VICRYL 0 TIES 12 18 (SUTURE) ×4 IMPLANT
SUT VICRYL 0 UR6 27IN ABS (SUTURE) IMPLANT
SYR 50ML LL SCALE MARK (SYRINGE) ×4 IMPLANT
SYR CONTROL 10ML LL (SYRINGE) ×4 IMPLANT
TOWEL OR 17X24 6PK STRL BLUE (TOWEL DISPOSABLE) ×8 IMPLANT
TRAY FOLEY CATH SILVER 14FR (SET/KITS/TRAYS/PACK) ×4 IMPLANT
TROCAR BALL TOP DISP 5MM (ENDOMECHANICALS) IMPLANT
TROCAR XCEL DIL TIP R 11M (ENDOMECHANICALS) IMPLANT
TUBING NON-CON 1/4 X 20 CONN (TUBING) ×3 IMPLANT
TUBING NON-CON 1/4 X 20' CONN (TUBING) ×1
WARMER LAPAROSCOPE (MISCELLANEOUS) ×4 IMPLANT
WATER STERILE IRR 1000ML POUR (IV SOLUTION) IMPLANT

## 2015-01-12 NOTE — OR Nursing (Signed)
Family notified of surgical progress at 1150 per [place]. Family updated again per Linde Gillis CRNA Jerene Dilling

## 2015-01-12 NOTE — H&P (Signed)
History and Physical Interval Note:   01/12/2015   10:09 AM   [name]  has presented today for surgery, with the diagnosis of Ovarian Cyst, Pelvic Pain   The various methods of treatment have been discussed with the patient and family. After consideration of risks, benefits and other options for treatment, the patient has consented to  Procedure(s): LAPAROSCOPIC LEFT OOPHORECTOMY POSSIBLE LAPAROTOMY as a surgical intervention .  I have reviewed the patients' chart and labs.  Questions were answered to the patient's satisfaction.  Pt received single dose Lovenox prophylaxis for history of DVT   [name]  MD

## 2015-01-12 NOTE — Consult Note (Signed)
  General Surgery [facility]  Referring MD:  Dr. Kendall Flack  Reason for intra-op consult:  Serosal tear of sigmoid colon  Intra-operative consult requested urgently by Dr. Leo Grosser for recognized serosal tear of sigmoid colon during dissection and lysis of adhesions in the pelvis while performing left oophorectomy.  See op note.  Further management and follow-up per Dr. Leo Grosser.  Earnstine Regal, MD, [facility] Office: [phone]

## 2015-01-12 NOTE — OR Nursing (Signed)
Patients mother notified of surgical progress per Jarold Song CRNA at [phone]. General surgeon called to come in for possible bowel injury per request Dr. Leo Grosser at 1415. Dr. Excell Seltzer called to say he will be here after his surgery is complete at [facility]. States he is finishing up there shortly.

## 2015-01-12 NOTE — Transfer of Care (Signed)
Immediate Anesthesia Transfer of Care Note  Patient: [name]  Procedure(s) Performed: Procedure(s): LAPAROSCOPY (Left) LAPAROTOMY (N/A)  Patient Location: PACU  Anesthesia Type:General  Level of Consciousness: awake, alert  and oriented  Airway & Oxygen Therapy: Patient Spontanous Breathing and Patient connected to nasal cannula oxygen  Post-op Assessment: Report given to RN  Post vital signs: Reviewed and stable  Last Vitals:  Filed Vitals:   01/12/15 0828  BP: 120/77  Pulse: 76  Temp: 36.8 C  Resp: 20    Complications: No apparent anesthesia complications

## 2015-01-12 NOTE — Anesthesia Preprocedure Evaluation (Signed)
Anesthesia Evaluation  Patient identified by MRN, date of birth, ID band Patient awake    Reviewed: Allergy & Precautions, H&P , NPO status , Patient's Chart, lab work & pertinent test results  Airway Mallampati: I  TM Distance: >3 FB Neck ROM: full    Dental no notable dental hx. (+) Teeth Intact   Pulmonary neg pulmonary ROS,    Pulmonary exam normal       Cardiovascular hypertension, Pt. on medications Normal cardiovascular exam    Neuro/Psych    GI/Hepatic negative GI ROS, Neg liver ROS,   Endo/Other  negative endocrine ROS  Renal/GU negative Renal ROS     Musculoskeletal   Abdominal (+) + obese,   Peds  Hematology negative hematology ROS (+)   Anesthesia Other Findings   Reproductive/Obstetrics negative OB ROS                             Anesthesia Physical Anesthesia Plan  ASA: II  Anesthesia Plan: General   Post-op Pain Management:    Induction: Intravenous  Airway Management Planned: Oral ETT  Additional Equipment:   Intra-op Plan:   Post-operative Plan: Extubation in OR  Informed Consent: I have reviewed the patients History and Physical, chart, labs and discussed the procedure including the risks, benefits and alternatives for the proposed anesthesia with the patient or authorized representative who has indicated his/her understanding and acceptance.   Dental Advisory Given  Plan Discussed with: CRNA and Surgeon  Anesthesia Plan Comments:         Anesthesia Quick Evaluation

## 2015-01-12 NOTE — Op Note (Signed)
General Surgery [facility]  Pre-op diagnosis: Serosal tear of sigmoid colon  Post-op diagnosis: same  Procedure: Primary repair of serosal tear of sigmoid colon  Surgeon: Armandina Gemma, MD, FACS  Anes:  General  EBL:  Minimal  Complx: none  Indications:  Intra-operative consult requested urgently by Dr. Leo Grosser for recognized serosal tear of sigmoid colon during dissection and lysis of adhesions in the pelvis while performing left oophorectomy.  Procedure:  Patient was in room under general anesthesia with ongoing case as reported by Dr. Leo Grosser.  At her invitation, I scrubbed into case.  During dissection in the pelvis, a serosal tear had been created on the antimesenteric border of the sigmoid colon.  This was recognized and marked with a silk suture.  I explored the sigmoid colon and established the length of the tear and extent of the injury.  There was no evidence of bowel perforation or contamination.  The serosal tear was closed with interrupted 3-0 silk simple sutures over a length of 3.5 cm.  The area was irrigated and appeared intact.  Dr. Leo Grosser then proceeded with the procedure which is dictated under a separate operative report.  Earnstine Regal, MD, [facility] Office: [phone]

## 2015-01-12 NOTE — Anesthesia Postprocedure Evaluation (Signed)
  Anesthesia Post-op Note  Patient: [name]  Procedure(s) Performed: Procedure(s) (LRB): LAPAROSCOPY (Left) LAPAROTOMY (N/A) OOPHORECTOMY (Left)  Patient Location: PACU  Anesthesia Type: General  Level of Consciousness: awake and alert   Airway and Oxygen Therapy: Patient Spontanous Breathing  Post-op Pain: mild  Post-op Assessment: Post-op Vital signs reviewed, Patient's Cardiovascular Status Stable, Respiratory Function Stable, Patent Airway and No signs of Nausea or vomiting  Last Vitals:  Filed Vitals:   01/12/15 1730  BP: 121/76  Pulse: 105  Temp:   Resp: 16    Post-op Vital Signs: stable   Complications: No apparent anesthesia complications

## 2015-01-12 NOTE — Anesthesia Procedure Notes (Signed)
Procedure Name: Intubation Date/Time: 01/12/2015 10:30 AM Performed by: Tobin Chad Pre-anesthesia Checklist: Patient identified, Timeout performed, Emergency Drugs available, Patient being monitored and Suction available Patient Re-evaluated:Patient Re-evaluated prior to inductionOxygen Delivery Method: Circle system utilized and Simple face mask Preoxygenation: Pre-oxygenation with 100% oxygen Intubation Type: IV induction Ventilation: Mask ventilation without difficulty Laryngoscope Size: Mac and 3 Grade View: Grade III Tube size: 7.0 mm Number of attempts: 1 Airway Equipment and Method: Stylet Placement Confirmation: positive ETCO2,  CO2 detector and breath sounds checked- equal and bilateral Secured at: 22 cm Tube secured with: Tape Dental Injury: Teeth and Oropharynx as per pre-operative assessment

## 2015-01-13 ENCOUNTER — Encounter (HOSPITAL_COMMUNITY): Payer: Self-pay | Admitting: Obstetrics and Gynecology

## 2015-01-13 LAB — COMPREHENSIVE METABOLIC PANEL
ALK PHOS: 49 U/L (ref 38–126)
ALT: 15 U/L (ref 14–54)
AST: 22 U/L (ref 15–41)
Albumin: 3.1 g/dL — ABNORMAL LOW (ref 3.5–5.0)
Anion gap: 9 (ref 5–15)
BUN: 8 mg/dL (ref 6–20)
CALCIUM: 8.8 mg/dL — AB (ref 8.9–10.3)
CO2: 25 mmol/L (ref 22–32)
CREATININE: 0.71 mg/dL (ref 0.44–1.00)
Chloride: 104 mmol/L (ref 101–111)
GLUCOSE: 136 mg/dL — AB (ref 65–99)
Potassium: 4.2 mmol/L (ref 3.5–5.1)
SODIUM: 138 mmol/L (ref 135–145)
Total Bilirubin: 0.5 mg/dL (ref 0.3–1.2)
Total Protein: 6.7 g/dL (ref 6.5–8.1)

## 2015-01-13 LAB — CBC
HCT: 37.3 % (ref 36.0–46.0)
Hemoglobin: 12.1 g/dL (ref 12.0–15.0)
MCH: 31.2 pg (ref 26.0–34.0)
MCHC: 32.4 g/dL (ref 30.0–36.0)
MCV: 96.1 fL (ref 78.0–100.0)
PLATELETS: 306 10*3/uL (ref 150–400)
RBC: 3.88 MIL/uL (ref 3.87–5.11)
RDW: 13.2 % (ref 11.5–15.5)
WBC: 11.7 10*3/uL — ABNORMAL HIGH (ref 4.0–10.5)

## 2015-01-13 MED ORDER — MENTHOL 3 MG MT LOZG
1.0000 | LOZENGE | OROMUCOSAL | Status: DC | PRN
  Administered 2015-01-13: 3 mg via ORAL
  Filled 2015-01-13: qty 9

## 2015-01-13 NOTE — Op Note (Signed)
Operative  Laparoscopy, Laparotomy, Lysis of Adhesions and left salpingo-oophorectomy Procedure Note  Indications: The patient is a 49 y.o. female with persistent pelvic pain and persistent left ovarian cyst  Pre-operative Diagnosis: Running, pelvic pain. Persistent left ovarian cyst. Status post hysterectomy.  Post-operative Diagnosis: Hemorrhagic Ovarian cyst. Extensive pelvic peritoneal adhesions.  Surgeon: Eldred Manges   Assistants: Earnstine Regal certified physician assistant  Anesthesia: General endotracheal anesthesia  ASA Class: 3  Procedure Details  The patient was seen in the Holding Room. The risks, benefits, complications, treatment options, and expected outcomes were discussed with the patient. The possibilities of reaction to medication, pulmonary aspiration, perforation of viscus, bleeding, recurrent infection, the need for additional procedures, failure to diagnose a condition, and creating a complication requiring transfusion or operation were discussed with the patient. The patient concurred with the proposed plan, giving informed consent. The patient was taken to the Operating Room, identified as [name] and the procedure verified as Diagnostic Laparoscopy. A Time Out was held and the above information confirmed.  After induction of general anesthesia, the patient was placed in modified dorsal lithotomy position where she was prepped with DuraPrep on the abdomen and tech no prepped and the perineal area., draped, and a Foley catheter placed in the normal, sterile fashion.  A sponge stick containing 3 radiopaque sponges was placed in the vagina. A 2 cm umbilical incision was then performed and taken down to the fascia with a combination of blunt and sharp dissection. The fascia was identified and incised. Either side of the incision was tagged with a suture of 0-Vicryl and held. The peritoneum was entered sharply. A Hassan cannula was placed into the peritoneal  cavity under direct visualization and secured with the tagged sutures. The laparoscope was placed through the [facility] cannula with the finding of extensive peritoneal adhesions between the bowel and the anterior abdominal wall, the bowel and the pelvic sidewalls, and inability to identify either the right or left ovary. As a matter fact, the pelvis could not be visualized because of the sheet of adhesions that caused the bowel to obscure entry into the pelvis. At this time, the decision was made to proceed with laparotomy. The Hassan cannula was removed and the tagged sutures used to create figure-of-eight sutures for closure of the subumbilical fascia.  The cutaneous tissue was closed with interrupted sutures and the subumbilical skin incision closed with subcuticular sutures of 3-0 Monocryl. Dermabond was used to further secure the incision. The suprapubic region was then infiltrated with 10 cc of quarter percent Marcaine. A suprapubic incision was made and the peritoneal cavity entered with a combination of blunt and sharp dissection. The bowel was successively packed. As each area of the pelvis was addressed with respect to adhesions, which were sharply removed. Extensive adhesio lysis was required to first identify the right ovary, which was anteriorly and medially placed with extensive adhesions, but was normal in size and without palpable cyst. This was consistent with the preoperative ultrasound finding. Extensive lysis of adhesions was required to identify the left ovary, which was densely adherent to the left pelvic side wall and to overlying bowel. During the dissection of the left pelvic sidewall for identification and further release of the left ovary. A serosal defect was created in the sigmoid colon. This was subsequently repaired with several interrupted sutures of 3-0 silk by general surgery consultation. A combination of sharp and blunt dissection was continued until the left ovary and the large  cyst could be extirpated  from the left pelvic sidewall, taking care to identify the left ureter and keep it lateral to the dissection. The infundibulopelvic ligament was isolated after meticulous dissection of the adhesions and was clamped, cut and suture-ligated. A large ovarian cyst had ruptured during the dissection and was excised prior to the excision of the ovary at the infundibulopelvic ligament. The 2 sections of ovary and ovarian cyst were sent in a single pathology specimen. Once the ovary had been removed and the defect in the sigmoid colon serosa repaired. Copious irrigation was carried out. Stasis was noted to be adequate. All instruments and sponges were removed from the peritoneal cavity. Abdominal peritoneum was closed with a running suture of 2-0 Vicryl. The fascia was closed with running sutures of 0 Vicryl from each apex to the midline and tied in the midline. The subcutaneous tissue was copiously irrigated and made hemostatic with Bovie cautery. The skin incision was closed with a subcuticular suture of 3-0 Monocryl. Dermabond was placed over that incision and a honeycomb dressing applied.  The patient was awakened from general anesthesia after the sponges were removed from the vagina and taken to the recovery room in satisfactory condition having tolerated the procedure well.  Instrument, sponge, and needle counts were correct prior to abdominal closure and at the conclusion of the case.   Findings: The anterior cul-de-sac: Extensive adhesive disease as described above The uterus surgically absent The adnexa as described above Cul-de-sac stents of adhesions, but once these were lysed. The tinea was smooth without evidence of endometriosis  Estimated Blood Loss:  400 cc         Drains: None              Specimens: Left tube and ovary with left ovarian cyst              Complications:  Creation of a serosal defect approximately 4 cm long at the site of the dissection of the  sigmoid colon off the ovary         Disposition: PACU - hemodynamically stable.         Condition: stable

## 2015-01-13 NOTE — Anesthesia Postprocedure Evaluation (Signed)
Anesthesia Post Note  Patient: [name]  Procedure(s) Performed: Procedure(s) (LRB): LAPAROSCOPY (Left) LAPAROTOMY (N/A) OOPHORECTOMY (Left)  Anesthesia type: General  Patient location: Women's Unit  Post pain: Pain level controlled  Post assessment: Post-op Vital signs reviewed  Last Vitals:  Filed Vitals:   01/13/15 0900  BP: 106/73  Pulse: 79  Temp: 36.7 C  Resp: 15    Post vital signs: Reviewed  Level of consciousness: sedated  Complications: No apparent anesthesia complications

## 2015-01-13 NOTE — Progress Notes (Signed)
ED [name] is a33 y.o.  [id number]  Post Op Date # 1: Diagnostic L/S/Laparotomy/LSO/LOA  Subjective: Patient is Doing well postoperatively. Patient has Pain is controlled with current analgesics. Medications being used: narcotic analgesics including hydromorphone (Dilaudid). Ambulating in the halls without difficulty, tolerating clear liquids and  voided 100 cc of urine around 5 a.m.  Has not passed flatus.   Objective: Vital signs in last 24 hours: Temp:  [98.1 F (36.7 C)-98.9 F (37.2 C)] 98.1 F (36.7 C) (07/20 0516) Pulse Rate:  [76-118] 78 (07/20 0516) Resp:  [11-20] 11 (07/20 0516) BP: (97-137)/(51-80) 110/51 mmHg (07/20 0516) SpO2:  [97 %-100 %] 97 % (07/20 0516) Weight:  [198 lb 12.8 oz (90.175 kg)] 198 lb 12.8 oz (90.175 kg) (07/20 0102)  Intake/Output from previous day: 07/19 0701 - 07/20 0700 In: 6691.1 [P.O.:900; I.V.:5791.1] Out: 3625 [Urine:3125] Intake/Output this shift:    Recent Labs Lab 01/08/15 1050 01/13/15 0516  WBC 5.9 11.7*  HGB 14.0 12.1  HCT 41.4 37.3  PLT 323 306     Recent Labs Lab 01/08/15 1050 01/13/15 0516  NA 137 138  K 3.4* 4.2  CL 103 104  CO2 28 25  BUN 11 8  CREATININE 0.84 0.71  CALCIUM 9.0 8.8*  PROT 7.4 6.7  BILITOT 0.6 0.5  ALKPHOS 59 49  ALT 18 15  AST 15 22  GLUCOSE 80 136*    EXAM: General: alert, cooperative and no distress Resp: clear to auscultation bilaterally Cardio: regular rate and rhythm, S1, S2 normal, no murmur, click, rub or gallop GI: Bowel sounds present, dressing is clean/dry/intact Extremities: Homans sign is negative, no sign of DVT and SCD hose in place and functioning. Vaginal Bleeding: none   Assessment: s/p Procedure(s): LAPAROSCOPY LAPAROTOMY OOPHORECTOMY: stable and progressing well  Plan: Advance diet to Low residue diet which will be maintained through 01/17/15 then advance to regular at home Pt given written guide for low residue diet at home Advance to PO  medication Saline lock IV Routine care Plan d/c home 01/14/15  LOS: 1 day    [name],ELMIRA, PA-C 01/13/2015 7:11 AM

## 2015-01-14 DIAGNOSIS — J45909 Unspecified asthma, uncomplicated: Secondary | ICD-10-CM | POA: Diagnosis present

## 2015-01-14 MED ORDER — TRAMADOL HCL 50 MG PO TABS: 50.0000 mg | ORAL_TABLET | ORAL | Status: DC | PRN

## 2015-01-14 NOTE — Progress Notes (Signed)
teaching complete   Pt ambulate

## 2015-01-14 NOTE — Discharge Summary (Signed)
Physician Discharge Summary  Patient ID: [name] MRN: [id number] DOB/AGE: [birth year] 49 y.o.  Admit date: 01/12/2015 Discharge date: 01/14/2015  Admission Diagnoses:pelvic pain, ovarian cyst  Discharge Diagnoses: :pelvic pain, ovarian cyst.  Pelvic adhesions Principal Problem:   Pelvic pain in female Active Problems:   Ovarian cyst   Hypertension   Asthma, chronic   Discharged Condition: stable  Hospital Course: The pt was admitted to the  Hospital after undergoing laparoscopy, laparotomy, extensive lysis of adhesions and left salpingo-oophorectomy.  Her post op course was unremarkable and by post op day 2, she was tolerating a low residue diet, was afebrile with good relief of pain with oral pain meds and ready for discharge home.  Consults: general surgery intraoperative consultation for sigmoid colon serosal disruption and repair.  Significant Diagnostic Studies: labs: within normal limits with normalization of previously low potassium and pathology pending at the time of discharge  Treatments: IV hydration, analgesia: tramadol, anticoagulation: LMW heparin for 2 doses for DVT prophylaxis, discontinued when pt was fully ambulatory and surgery as noted above  Discharge Exam: Blood pressure 103/61, pulse 78, temperature 99.1 F (37.3 C), temperature source Oral, resp. rate 16, height 5\' 2"  (1.575 m), weight 198 lb 12.8 oz (90.175 kg), SpO2 96 %. General appearance: alert, cooperative, no distress and mildly obese Resp: clear to auscultation bilaterally Cardio: regular rate and rhythm, S1, S2 normal, no murmur, click, rub or gallop GI: soft, non-tender; bowel sounds normal; no masses,  no organomegaly and Incision clean and dry Extremities: extremities normal, atraumatic, no cyanosis or edema  No vaginal bleeding  Disposition: 01-Home or Self Care     Medication List    STOP taking these medications        fluconazole 150 MG tablet  Commonly known as:  DIFLUCAN     HYDROmorphone 2 MG tablet  Commonly known as:  DILAUDID     nitrofurantoin 50 MG capsule  Commonly known as:  MACRODANTIN      TAKE these medications        albuterol 108 (90 BASE) MCG/ACT inhaler  Commonly known as:  PROVENTIL HFA;VENTOLIN HFA  Inhale 2 puffs into the lungs every 6 (six) hours as needed for wheezing or shortness of breath.     CALCIUM-MAGNESIUM-ZINC PO  Take 1 tablet by mouth daily.     cholecalciferol 1000 UNITS tablet  Commonly known as:  VITAMIN D  Take 1,000 Units by mouth daily.     ELMIRON 100 MG capsule  Generic drug:  pentosan polysulfate  Take 1 capsule by mouth 3 (three) times daily.     losartan-hydrochlorothiazide 100-25 MG per tablet  Commonly known as:  HYZAAR  Take 1 tablet by mouth daily.     traMADol 50 MG tablet  Commonly known as:  ULTRAM  Take 1-2 tablets (50-100 mg total) by mouth every 4 (four) hours as needed for moderate pain.     vitamin B-12 1000 MCG tablet  Commonly known as:  CYANOCOBALAMIN  Take 1,000 mcg by mouth daily.           Follow-up Information    Follow up with [facility] P, MD In 6 weeks.   Specialty:  Obstetrics and Gynecology   Contact information:   [address] [place] [address] [phone]       Follow up with Eldred Manges, MD.   Specialty:  Obstetrics and Gynecology   Contact information:   [address] [place] [address] [phone]  SignedKendall Flack P 01/14/2015, 8:24 AM

## 2015-01-14 NOTE — Discharge Instructions (Signed)
Laparotomy °Care After °Refer to this sheet in the next few weeks. These instructions provide you with information on caring for yourself after your procedure. Your caregiver may also give you more specific instructions. Your treatment has been planned according to current medical practices, but problems sometimes occur. Call your caregiver if you have any problems or questions after your procedure. °HOME CARE INSTRUCTIONS °ACTIVITY °· Rest as much as possible the first two weeks at home. °· Avoid strenuous activity such as heavy lifting (more than 10 pounds), pushing, or pulling. Limit stair climbing to once or twice a day for the first week, then slowly increase this activity. °· Take frequent rest periods throughout the day. °· Talk with your caregiver about when you may resume your usual physical activity. °· You need to be out of bed and walking as much as possible. This decreases the chance of: °¨ Blood clots. °¨ Pneumonia. °NUTRITION °· You can resume your normal diet once you regain bowel function. °· Drink plenty of fluids (6-8 glasses a day or as instructed by your caregiver). °· Eat a well-balanced diet. °· Daily portions of food from the meat (protein), milk, vegetable, and bread groups are necessary for your health. °ELIMINATION °It is very important not to strain during bowel movements. If constipation should occur, you may: °· Take a mild laxative. °· Add fruit and bran to your diet. °· Drink more fluids. °HYGIENE °· Take showers, not baths, until 4-6 weeks after surgery. °· If your incision is closed, you may take a shower or tub bath. °FEVER °If you feel feverish or have shaking chills, take your temperature. If it is 102° F (38.9° C), call your caregiver. The fever may mean there is an infection. °PAIN CONTROL °· Mild discomfort may occur. °· Only take over-the-counter or prescription medicines for pain, discomfort, or fever as directed by your caregiver. Take any prescribed medicines exactly as  directed. °INCISION CARE °· Keep your incision site clean with soap and water. °· Do not use a dressing unless your cut (incision) from surgery is draining or irritated. °· If you have small adhesive strips in place and they do not fall off within 10 days, carefully peel them off. °· Check your incision and surrounding area daily for any redness, swelling, discoloration, heavy drainage, or separation of the skin. °SEXUAL INTERCOURSE °Do not have sexual intercourse until after your follow-up appointment, unless your caregiver tells you otherwise. °SEEK MEDICAL CARE IF:  °· You are unable to tolerate food or drinks. °· You are unable to pass gas or have a bowel movement. °· Your pain becomes more severe or is not relieved with medicines. °· You have redness, swelling, discoloration, heavy drainage, or separation of the skin at the incision site. °Document Released: 01/25/2004 Document Revised: 05/29/2012 Document Reviewed: 06/11/2007 °ExitCare® Patient Information ©2015 [employer]. This information is not intended to replace advice given to you by your health care provider. Make sure you discuss any questions you have with your health care provider. ° °

## 2015-04-20 ENCOUNTER — Emergency Department (HOSPITAL_COMMUNITY): Payer: BC Managed Care – PPO

## 2015-04-20 ENCOUNTER — Encounter (HOSPITAL_COMMUNITY): Payer: Self-pay | Admitting: *Deleted

## 2015-04-20 ENCOUNTER — Emergency Department (HOSPITAL_COMMUNITY)
Admission: EM | Admit: 2015-04-20 | Discharge: 2015-04-20 | Disposition: A | Discharge status: Home / Self Care | Payer: BC Managed Care – PPO | Attending: Emergency Medicine | Admitting: Emergency Medicine

## 2015-04-20 DIAGNOSIS — Z86718 Personal history of other venous thrombosis and embolism: Secondary | ICD-10-CM | POA: Diagnosis not present

## 2015-04-20 DIAGNOSIS — R52 Pain, unspecified: Secondary | ICD-10-CM

## 2015-04-20 DIAGNOSIS — R1031 Right lower quadrant pain: Secondary | ICD-10-CM | POA: Diagnosis not present

## 2015-04-20 DIAGNOSIS — R140 Abdominal distension (gaseous): Secondary | ICD-10-CM | POA: Insufficient documentation

## 2015-04-20 DIAGNOSIS — Z79899 Other long term (current) drug therapy: Secondary | ICD-10-CM | POA: Diagnosis not present

## 2015-04-20 DIAGNOSIS — Z8742 Personal history of other diseases of the female genital tract: Secondary | ICD-10-CM | POA: Insufficient documentation

## 2015-04-20 DIAGNOSIS — Z86018 Personal history of other benign neoplasm: Secondary | ICD-10-CM | POA: Diagnosis not present

## 2015-04-20 DIAGNOSIS — Z8619 Personal history of other infectious and parasitic diseases: Secondary | ICD-10-CM | POA: Diagnosis not present

## 2015-04-20 DIAGNOSIS — I10 Essential (primary) hypertension: Secondary | ICD-10-CM | POA: Diagnosis not present

## 2015-04-20 DIAGNOSIS — G8929 Other chronic pain: Secondary | ICD-10-CM | POA: Insufficient documentation

## 2015-04-20 DIAGNOSIS — Z87828 Personal history of other (healed) physical injury and trauma: Secondary | ICD-10-CM | POA: Insufficient documentation

## 2015-04-20 DIAGNOSIS — R109 Unspecified abdominal pain: Secondary | ICD-10-CM

## 2015-04-20 DIAGNOSIS — Z8639 Personal history of other endocrine, nutritional and metabolic disease: Secondary | ICD-10-CM | POA: Diagnosis not present

## 2015-04-20 LAB — URINALYSIS, ROUTINE W REFLEX MICROSCOPIC
Bilirubin Urine: NEGATIVE
Glucose, UA: NEGATIVE mg/dL
Ketones, ur: 15 mg/dL — AB
LEUKOCYTES UA: NEGATIVE
NITRITE: NEGATIVE
PH: 6 (ref 5.0–8.0)
Protein, ur: NEGATIVE mg/dL
Specific Gravity, Urine: 1.03 (ref 1.005–1.030)
Urobilinogen, UA: 0.2 mg/dL (ref 0.0–1.0)

## 2015-04-20 LAB — CBC
HCT: 42.3 % (ref 36.0–46.0)
Hemoglobin: 14.1 g/dL (ref 12.0–15.0)
MCH: 31.3 pg (ref 26.0–34.0)
MCHC: 33.3 g/dL (ref 30.0–36.0)
MCV: 93.8 fL (ref 78.0–100.0)
Platelets: 302 10*3/uL (ref 150–400)
RBC: 4.51 MIL/uL (ref 3.87–5.11)
RDW: 12.2 % (ref 11.5–15.5)
WBC: 6.2 10*3/uL (ref 4.0–10.5)

## 2015-04-20 LAB — COMPREHENSIVE METABOLIC PANEL
ALT: 14 U/L (ref 14–54)
AST: 13 U/L — AB (ref 15–41)
Albumin: 3.4 g/dL — ABNORMAL LOW (ref 3.5–5.0)
Alkaline Phosphatase: 56 U/L (ref 38–126)
Anion gap: 10 (ref 5–15)
BUN: 9 mg/dL (ref 6–20)
CHLORIDE: 102 mmol/L (ref 101–111)
CO2: 27 mmol/L (ref 22–32)
Calcium: 9.6 mg/dL (ref 8.9–10.3)
Creatinine, Ser: 0.94 mg/dL (ref 0.44–1.00)
GFR calc Af Amer: >60 mL/min (ref >60)
Glucose, Bld: 97 mg/dL (ref 65–99)
POTASSIUM: 3.7 mmol/L (ref 3.5–5.1)
SODIUM: 139 mmol/L (ref 135–145)
Total Bilirubin: 0.5 mg/dL (ref 0.3–1.2)
Total Protein: 7.5 g/dL (ref 6.5–8.1)

## 2015-04-20 LAB — URINE MICROSCOPIC-ADD ON

## 2015-04-20 LAB — LIPASE, BLOOD: LIPASE: 45 U/L (ref 11–51)

## 2015-04-20 MED ORDER — DICYCLOMINE HCL 10 MG/ML IM SOLN
20.0000 mg | Freq: Once | INTRAMUSCULAR | Status: AC
  Administered 2015-04-20: 20 mg via INTRAMUSCULAR
  Filled 2015-04-20: qty 2

## 2015-04-20 MED ORDER — DICYCLOMINE HCL 20 MG PO TABS: 20.0000 mg | ORAL_TABLET | Freq: Two times a day (BID) | ORAL | Status: DC

## 2015-04-20 MED ORDER — GI COCKTAIL ~~LOC~~
30.0000 mL | Freq: Once | ORAL | Status: AC
  Administered 2015-04-20: 30 mL via ORAL
  Filled 2015-04-20: qty 30

## 2015-04-20 MED ORDER — ONDANSETRON 4 MG PO TBDP
4.0000 mg | ORAL_TABLET | Freq: Once | ORAL | Status: AC | PRN
  Administered 2015-04-20: 4 mg via ORAL

## 2015-04-20 MED ORDER — OMEPRAZOLE 20 MG PO CPDR: 20.0000 mg | DELAYED_RELEASE_CAPSULE | Freq: Every day | ORAL | Status: DC

## 2015-04-20 MED ORDER — KETOROLAC TROMETHAMINE 60 MG/2ML IM SOLN
60.0000 mg | Freq: Once | INTRAMUSCULAR | Status: AC
  Administered 2015-04-20: 60 mg via INTRAMUSCULAR
  Filled 2015-04-20: qty 2

## 2015-04-20 NOTE — ED Notes (Signed)
Patient transported to CT 

## 2015-04-20 NOTE — ED Provider Notes (Signed)
CSN: [id number]     Arrival date & time 04/20/15  0204 History   First MD Initiated Contact with Patient 04/20/15 0431     Chief Complaint  Patient presents with  . Abdominal Pain     (Consider location/radiation/quality/duration/timing/severity/associated sxs/prior Treatment) Patient is a 49 y.o. female presenting with abdominal pain.  Abdominal Pain Pain location:  R flank Pain quality: cramping   Pain radiates to:  Does not radiate Pain severity:  Severe Onset quality:  Sudden Timing:  Constant Progression:  Unchanged Chronicity:  New Context: eating   Relieved by:  Nothing Worsened by:  Nothing tried Ineffective treatments:  None tried Associated symptoms: flatus   Associated symptoms: no chest pain, no constipation, no cough, no diarrhea, no fatigue, no shortness of breath, no vaginal bleeding, no vaginal discharge and no vomiting   Associated symptoms comment:  NO SOB, no leg pain  Risk factors: no aspirin use and not pregnant     Past Medical History  Diagnosis Date  . PCOS (polycystic ovarian syndrome)   . History of PID [place]  . History of DVT (deep vein thrombosis) 2004  . Chronic female pelvic pain   . History of ovarian cyst   . Pelvic adhesions     HISTORY OF  . Contusion, breast   . Anxiety   . PID (pelvic inflammatory disease)   . ASCUS (atypical squamous cells of undetermined significance) on Pap smear   . Infertility associated with anovulation   . Fibroids   . HSV-2 infection   . DVT (deep venous thrombosis) ([place])   . Hypertension    Past Surgical History  Procedure Laterality Date  . Abdominal hysterectomy    . Breast surgery  2004    BREAST REDUCTION  . Carpal tunnel release  2009     RIGHT WRIST  . Carpal tunnel release  2010    LEFT WRIST  . Dilation and curettage of uterus    . Wisdom tooth extraction    . Laparoscopic salpingo oopherectomy Left 01/12/2015    Procedure: LAPAROSCOPY;  Surgeon: Eldred Manges, MD;  Location:  [facility];  Service: Gynecology;  Laterality: Left;  . Laparotomy N/A 01/12/2015    Procedure: LAPAROTOMY;  Surgeon: Eldred Manges, MD;  Location: [facility];  Service: Gynecology;  Laterality: N/A;  . Oophorectomy Left 01/12/2015    Procedure: OOPHORECTOMY;  Surgeon: Eldred Manges, MD;  Location: [facility];  Service: Gynecology;  Laterality: Left;   Family History  Problem Relation Age of Onset  . Cancer Paternal Grandmother 73    OVARIAN  . Hypertension Mother   . Hypertension Father   . Diabetes Maternal Aunt   . Cancer Paternal Aunt 54    CERVICAL  . Stroke Maternal Grandmother    Social History  Substance Use Topics  . Smoking status: Never Smoker   . Smokeless tobacco: Never Used  . Alcohol Use: No   OB History    Gravida Para Term Preterm AB TAB SAB Ectopic Multiple Living   0 0 0 0 0 0 0 0 0 0      Review of Systems  Constitutional: Negative for fatigue.  Respiratory: Negative for cough and shortness of breath.   Cardiovascular: Negative for chest pain, palpitations and leg swelling.  Gastrointestinal: Positive for abdominal pain and flatus. Negative for vomiting, diarrhea and constipation.  Genitourinary: Positive for flank pain. Negative for vaginal bleeding and vaginal discharge.  All other systems reviewed and are negative.  Allergies  Doxycycline; Hydrocodone; Percocet; Iodine; and Shellfish allergy  Home Medications   Prior to Admission medications   Medication Sig Start Date End Date Taking? Authorizing Provider  albuterol (PROVENTIL HFA;VENTOLIN HFA) 108 (90 BASE) MCG/ACT inhaler Inhale 2 puffs into the lungs every 6 (six) hours as needed for wheezing or shortness of breath.    Historical Provider, MD  CALCIUM-MAGNESIUM-ZINC PO Take 1 tablet by mouth daily.    Historical Provider, MD  cholecalciferol (VITAMIN D) 1000 UNITS tablet Take 1,000 Units by mouth daily.    Historical Provider, MD  ELMIRON 100 MG capsule Take 1 capsule by mouth 3 (three) times  daily. 10/26/14   Historical Provider, MD  losartan-hydrochlorothiazide (HYZAAR) 100-25 MG per tablet Take 1 tablet by mouth daily. 10/28/14   Historical Provider, MD  traMADol (ULTRAM) 50 MG tablet Take 1-2 tablets (50-100 mg total) by mouth every 4 (four) hours as needed for moderate pain. 01/14/15   Eldred Manges, MD  vitamin B-12 (CYANOCOBALAMIN) 1000 MCG tablet Take 1,000 mcg by mouth daily.    Historical Provider, MD   BP 120/78 mmHg  Pulse 69  Temp(Src) 99.2 F (37.3 C) (Oral)  Resp 16  SpO2 100% Physical Exam  Constitutional: She is oriented to person, place, and time. She appears well-developed and well-nourished. No distress.  HENT:  Head: Normocephalic and atraumatic.  Mouth/Throat: Oropharynx is clear and moist.  Eyes: Conjunctivae are normal. Pupils are equal, round, and reactive to light.  Neck: Normal range of motion. Neck supple.  Cardiovascular: Normal rate, regular rhythm and intact distal pulses.   Pulmonary/Chest: Effort normal and breath sounds normal. No respiratory distress. She has no wheezes. She has no rales.  Abdominal: Soft. She exhibits distension. She exhibits no mass. Bowel sounds are increased. There is no hepatosplenomegaly. There is no tenderness. There is no rigidity, no rebound, no guarding, no tenderness at McBurney's point and negative Murphy's sign.    Gassy throughout colonic distribution most pronounced at hepatic flexure,  No pelvic pain denies any pelvic pain   Musculoskeletal: Normal range of motion. She exhibits no edema or tenderness.  Neurological: She is alert and oriented to person, place, and time. She has normal reflexes.  Skin: Skin is warm and dry.  Psychiatric: She has a normal mood and affect.    ED Course  Procedures (including critical care time) Labs Review Labs Reviewed  COMPREHENSIVE METABOLIC PANEL - Abnormal; Notable for the following:    Albumin 3.4 (*)    AST 13 (*)    All other components within normal limits    URINALYSIS, ROUTINE W REFLEX MICROSCOPIC (NOT AT [facility]) - Abnormal; Notable for the following:    APPearance CLOUDY (*)    Hgb urine dipstick SMALL (*)    Ketones, ur 15 (*)    All other components within normal limits  URINE MICROSCOPIC-ADD ON - Abnormal; Notable for the following:    Squamous Epithelial / LPF FEW (*)    Crystals CA OXALATE CRYSTALS (*)    All other components within normal limits  LIPASE, BLOOD  CBC    Imaging Review Ct Renal Stone Study  04/20/2015  CLINICAL DATA:  Acute onset of right-sided abdominal pain and nausea. Initial encounter. EXAM: CT ABDOMEN AND PELVIS WITHOUT CONTRAST TECHNIQUE: Multidetector CT imaging of the abdomen and pelvis was performed following the standard protocol without IV contrast. COMPARISON:  CT of the abdomen performed 11/24/2009, and abdominal ultrasound performed 12/10/2009. FINDINGS: The visualized lung bases are clear. The  liver and spleen are unremarkable in appearance. The gallbladder is within normal limits. The pancreas and adrenal glands are unremarkable. The kidneys are unremarkable in appearance. There is no evidence of hydronephrosis. No renal or ureteral stones are seen. No perinephric stranding is appreciated. No free fluid is identified. The small bowel is unremarkable in appearance. The stomach is within normal limits. No acute vascular abnormalities are seen. The appendix is normal in caliber and contains air, without evidence of appendicitis. The colon is unremarkable in appearance. The bladder is mildly distended and grossly unremarkable. A 4.7 cm right adnexal cystic lesion is noted. The patient is status post hysterectomy. No inguinal lymphadenopathy is seen. No acute osseous abnormalities are identified. Mild vacuum phenomenon is noted at L5-S1. IMPRESSION: 1. 4.7 cm right adnexal cystic lesion noted. Pelvic ultrasound would be helpful for further evaluation, when and as deemed clinically appropriate. 2. Otherwise unremarkable  noncontrast CT of the abdomen and pelvis. Electronically Signed   By: Garald Balding M.D.   On: 04/20/2015 05:31   I have personally reviewed and evaluated these images and lab results as part of my medical decision-making.   EKG Interpretation None      MDM   Final diagnoses:  Pain    Results for orders placed or performed during the hospital encounter of 04/20/15  Lipase, blood  Result Value Ref Range   Lipase 45 11 - 51 U/L  Comprehensive metabolic panel  Result Value Ref Range   Sodium 139 135 - 145 mmol/L   Potassium 3.7 3.5 - 5.1 mmol/L   Chloride 102 101 - 111 mmol/L   CO2 27 22 - 32 mmol/L   Glucose, Bld 97 65 - 99 mg/dL   BUN 9 6 - 20 mg/dL   Creatinine, Ser 0.94 0.44 - 1.00 mg/dL   Calcium 9.6 8.9 - 10.3 mg/dL   Total Protein 7.5 6.5 - 8.1 g/dL   Albumin 3.4 (L) 3.5 - 5.0 g/dL   AST 13 (L) 15 - 41 U/L   ALT 14 14 - 54 U/L   Alkaline Phosphatase 56 38 - 126 U/L   Total Bilirubin 0.5 0.3 - 1.2 mg/dL   GFR calc non Af Amer >60 >60 mL/min   GFR calc Af Amer >60 >60 mL/min   Anion gap 10 5 - 15  CBC  Result Value Ref Range   WBC 6.2 4.0 - 10.5 K/uL   RBC 4.51 3.87 - 5.11 MIL/uL   Hemoglobin 14.1 12.0 - 15.0 g/dL   HCT 42.3 36.0 - 46.0 %   MCV 93.8 78.0 - 100.0 fL   MCH 31.3 26.0 - 34.0 pg   MCHC 33.3 30.0 - 36.0 g/dL   RDW 12.2 11.5 - 15.5 %   Platelets 302 150 - 400 K/uL  Urinalysis, Routine w reflex microscopic (not at [facility])  Result Value Ref Range   Color, Urine YELLOW YELLOW   APPearance CLOUDY (A) CLEAR   Specific Gravity, Urine 1.030 1.005 - 1.030   pH 6.0 5.0 - 8.0   Glucose, UA NEGATIVE NEGATIVE mg/dL   Hgb urine dipstick SMALL (A) NEGATIVE   Bilirubin Urine NEGATIVE NEGATIVE   Ketones, ur 15 (A) NEGATIVE mg/dL   Protein, ur NEGATIVE NEGATIVE mg/dL   Urobilinogen, UA 0.2 0.0 - 1.0 mg/dL   Nitrite NEGATIVE NEGATIVE   Leukocytes, UA NEGATIVE NEGATIVE  Urine microscopic-add on  Result Value Ref Range   Squamous Epithelial / LPF FEW (A) RARE     WBC, UA 0-2 <  3 WBC/hpf   RBC / HPF 3-6 <3 RBC/hpf   Bacteria, UA RARE RARE   Crystals CA OXALATE CRYSTALS (A) NEGATIVE   Urine-Other MUCOUS PRESENT    Ct Renal Stone Study  04/20/2015  CLINICAL DATA:  Acute onset of right-sided abdominal pain and nausea. Initial encounter. EXAM: CT ABDOMEN AND PELVIS WITHOUT CONTRAST TECHNIQUE: Multidetector CT imaging of the abdomen and pelvis was performed following the standard protocol without IV contrast. COMPARISON:  CT of the abdomen performed 11/24/2009, and abdominal ultrasound performed 12/10/2009. FINDINGS: The visualized lung bases are clear. The liver and spleen are unremarkable in appearance. The gallbladder is within normal limits. The pancreas and adrenal glands are unremarkable. The kidneys are unremarkable in appearance. There is no evidence of hydronephrosis. No renal or ureteral stones are seen. No perinephric stranding is appreciated. No free fluid is identified. The small bowel is unremarkable in appearance. The stomach is within normal limits. No acute vascular abnormalities are seen. The appendix is normal in caliber and contains air, without evidence of appendicitis. The colon is unremarkable in appearance. The bladder is mildly distended and grossly unremarkable. A 4.7 cm right adnexal cystic lesion is noted. The patient is status post hysterectomy. No inguinal lymphadenopathy is seen. No acute osseous abnormalities are identified. Mild vacuum phenomenon is noted at L5-S1. IMPRESSION: 1. 4.7 cm right adnexal cystic lesion noted. Pelvic ultrasound would be helpful for further evaluation, when and as deemed clinically appropriate. 2. Otherwise unremarkable noncontrast CT of the abdomen and pelvis. Electronically Signed   By: Garald Balding M.D.   On: 04/20/2015 05:31    Medications  ondansetron (ZOFRAN-ODT) disintegrating tablet 4 mg (4 mg Oral Given 04/20/15 0216)  dicyclomine (BENTYL) injection 20 mg (20 mg Intramuscular Given 04/20/15 0501)   gi cocktail (Maalox,Lidocaine,Donnatal) (30 mLs Oral Given 04/20/15 0501)  ketorolac (TORADOL) injection 60 mg (60 mg Intramuscular Given 04/20/15 0502)   Gas and cramping from colards, turnip greens and red beans eaten by patient just prior to bed.  Exam and vitals, labs and imaging are benign.  Will treat for same.  Strict return precautions given     Leshawn Straka, MD 04/20/15 [phone]

## 2015-04-20 NOTE — ED Notes (Signed)
Pt reports acute onset of rt side pain that awoke pt from her sleep - pain began approx 00:15 tonight. Pt admits to some nausea - denies vomiting or diarrhea.

## 2015-04-20 NOTE — ED Notes (Signed)
Pt back from CT

## 2016-08-03 ENCOUNTER — Other Ambulatory Visit: Payer: Self-pay | Admitting: Obstetrics and Gynecology

## 2016-08-23 ENCOUNTER — Encounter: Payer: Self-pay | Admitting: Gynecologic Oncology

## 2016-08-23 ENCOUNTER — Telehealth: Payer: Self-pay | Admitting: Gynecologic Oncology

## 2016-08-23 NOTE — Telephone Encounter (Signed)
Appt has been scheduled for the pt to see Dr. Denman George on 3/21 at 9am. Confirmed appt w/Chandra at [facility]. She will notify the pt. Letter mailed.

## 2016-09-11 NOTE — Progress Notes (Signed)
Consult Note: Gyn-Onc  Consult was requested by Dr. Leo Grosser for the evaluation of [name] 51 y.o. female  CC:  Chief Complaint  Patient presents with  . Lichen Sclerosus  . Complex Ovarian Cyst   Assessment/Plan:  [name]  is a 51 y.o.  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 I recommend surgical excision with wide local excision of the vulva. I explained the operative risks, particularly infection, wound separation, scarring. I discussed anticipated leave from work (2 weeks) and anticipated recovery. We reviewed instructions for perineal care postop.  Lichen simplex chronicus: I recommend a course of clobetasol after she has completed and healed from surgery.  Right ovarian cyst:  Based on the description of the cyst in the referral notes and the fact that it is reducing in size, I have a low suspicion for malignancy. Given her underlying chronic pain and diagnosed pelvic flood dysfunction, she is not a good candidate for surgical intervention for pain.  HPI:   [name] is a 51 year old woman who is seen in consultation at the request of Dr Leo Grosser for VIN 3, lichen simplex chronicus and a complex right ovarian cyst.  The patient has a long history of PCOS and has formed multiple cysts on her ovaries.  She has been recently followed for a right ovarian simple cyst which resolved and a 2.3cm complex hemorrhagic cyst which is also resolving.  At the time of the exam (on 08/03/16) she was noted to have a lesion on the left posterior vulva/buttocks. The patient had felt irritation in that area for approximately 9 months to 1 year. She is a nonsmoker with no recent hx of HPV related disease.  A biopsy was taken of the lesion and the perianal skin. The vulva/buttock skin on the left was resulted as VIN III and the left  perianal lesion was lichen simplex chronicus.   Current Meds:  Outpatient Encounter Prescriptions as of 09/13/2016  Medication Sig  . diazepam (VALIUM) 10 MG tablet Insert 1 tablet into vagina every 8 hours as needed for pelvic floor spasm  . albuterol (PROVENTIL HFA;VENTOLIN HFA) 108 (90 BASE) MCG/ACT inhaler Inhale 2 puffs into the lungs every 6 (six) hours as needed for wheezing or shortness of breath.  Marland Kitchen CALCIUM-MAGNESIUM-ZINC PO Take 1 tablet by mouth daily.  . cholecalciferol (VITAMIN D) 1000 UNITS tablet Take 1,000 Units by mouth daily.  . Cyanocobalamin (RA VITAMIN B-12 TR) 1000 MCG TBCR Take by mouth.  . dicyclomine (BENTYL) 20 MG tablet Take 1 tablet (20 mg total) by mouth 2 (two) times daily.  Marland Kitchen losartan-hydrochlorothiazide (HYZAAR) 100-25 MG per tablet Take 1 tablet by mouth daily.  . potassium chloride (MICRO-K) 10 MEQ CR capsule Take 5 mEq by mouth daily.  . traMADol (ULTRAM) 50 MG tablet Take 1-2 tablets (50-100 mg total) by mouth every 4 (four) hours as needed for moderate pain.  . vitamin E 1000 UNIT capsule Take by mouth.  . [DISCONTINUED] ELMIRON 100 MG capsule Take 1 capsule by mouth 3 (three) times daily.  . [DISCONTINUED] omeprazole (PRILOSEC) 20 MG capsule Take 1 capsule (20 mg total) by mouth daily.  . [DISCONTINUED] vitamin B-12 (CYANOCOBALAMIN) 1000 MCG tablet Take 1,000 mcg by mouth daily.   No facility-administered encounter medications on file as of 09/13/2016.  Allergy:  Allergies  Allergen Reactions  . Doxycycline Nausea And Vomiting  . Hydrocodone Itching  . Percocet [Oxycodone-Acetaminophen] Itching  . Iodine Rash    Contraindicated due to shellfish allergy.  Marland Kitchen Shellfish Allergy Rash    Social Hx:   Social History   Social History  . Marital status: Married    Spouse name: N/A  . Number of children: N/A  . Years of education: N/A   Occupational History  . Not on file.   Social History Main Topics  . Smoking status: Never Smoker  .  Smokeless tobacco: Never Used  . Alcohol use No  . Drug use: No  . Sexual activity: Yes    Partners: Male    Birth control/ protection: Surgical     Comment: HYST    Other Topics Concern  . Not on file   Social History Narrative  . No narrative on file    Past Surgical Hx:  Past Surgical History:  Procedure Laterality Date  . ABDOMINAL HYSTERECTOMY    . BREAST SURGERY  2004   BREAST REDUCTION  . CARPAL TUNNEL RELEASE  2009    RIGHT WRIST  . CARPAL TUNNEL RELEASE  2010   LEFT WRIST  . DILATION AND CURETTAGE OF UTERUS    . LAPAROSCOPIC SALPINGO OOPHERECTOMY Left 01/12/2015   Procedure: LAPAROSCOPY;  Surgeon: Eldred Manges, MD;  Location: [facility];  Service: Gynecology;  Laterality: Left;  . LAPAROTOMY N/A 01/12/2015   Procedure: LAPAROTOMY;  Surgeon: Eldred Manges, MD;  Location: [facility];  Service: Gynecology;  Laterality: N/A;  . OOPHORECTOMY Left 01/12/2015   Procedure: OOPHORECTOMY;  Surgeon: Eldred Manges, MD;  Location: [facility];  Service: Gynecology;  Laterality: Left;  . WISDOM TOOTH EXTRACTION      Past Medical Hx:  Past Medical History:  Diagnosis Date  . Anxiety   . ASCUS (atypical squamous cells of undetermined significance) on Pap smear   . Chronic female pelvic pain   . Contusion, breast   . DVT (deep venous thrombosis) ([place])   . Fibroids   . History of DVT (deep vein thrombosis) 2004  . History of ovarian cyst   . History of PID [place]  . HSV-2 infection   . Hypertension   . Infertility associated with anovulation   . PCOS (polycystic ovarian syndrome)   . Pelvic adhesions    HISTORY OF  . PID (pelvic inflammatory disease)     Past Gynecological History:  PCOS, pelvic floor dysfunction. No LMP recorded. Patient has had a hysterectomy.  Family Hx:  Family History  Problem Relation Age of Onset  . Cancer Paternal Grandmother 15    OVARIAN  . Diabetes Maternal Aunt   . Cancer Paternal Aunt 55    CERVICAL  . Stroke Maternal  Grandmother   . Hypertension Mother   . Hypertension Father     Review of Systems:  Constitutional  Feels well,    ENT Normal appearing ears and nares bilaterally Skin/Breast  No rash, sores, jaundice, itching, dryness Cardiovascular  No chest pain, shortness of breath, or edema  Pulmonary  No cough or wheeze.  Gastro Intestinal  No nausea, vomitting, or diarrhoea. No bright red blood per rectum, no abdominal pain, change in bowel movement, or constipation.  Genito Urinary  No frequency, urgency, dysuria, pelvic pain with micturition - intermitten Musculo Skeletal  No myalgia, arthralgia, joint swelling or pain  Neurologic  No weakness, numbness, change in gait,  Psychology  No depression, anxiety, insomnia.   Vitals:  Blood pressure 133/75, pulse 91, temperature 98.3 F (36.8 C), temperature source Oral, resp. rate 20, height 5' 2.99" (1.6 m), weight 199 lb 1.6 oz (90.3 kg).  Physical Exam: WD in NAD Neck  Supple NROM, without any enlargements.  Lymph Node Survey No cervical supraclavicular or inguinal adenopathy Cardiovascular  Pulse normal rate, regularity and rhythm. S1 and S2 normal.  Lungs  Clear to auscultation bilateraly, without wheezes/crackles/rhonchi. Good air movement.  Skin  No rash/lesions/breakdown  Psychiatry  Alert and oriented to person, place, and time  Abdomen  Normoactive bowel sounds, abdomen soft, non-tender and obese without evidence of hernia.  Back No CVA tenderness Genito Urinary  Vulva/vagina: 2cm leukoplakia on the left posterior vulva. Acetic acid applied to remaining vulva - no other lesions seen.   Rectal  deferred Extremities  No bilateral cyanosis, clubbing or edema.   Donaciano Eva, MD  09/13/2016, 9:40 AM

## 2016-09-13 ENCOUNTER — Ambulatory Visit: Payer: BC Managed Care – PPO | Attending: Gynecologic Oncology | Admitting: Gynecologic Oncology

## 2016-09-13 ENCOUNTER — Encounter: Payer: Self-pay | Admitting: Gynecologic Oncology

## 2016-09-13 VITALS — BP 133/75 | HR 91 | Temp 98.3°F | Resp 20 | Ht 62.99 in | Wt 199.1 lb

## 2016-09-13 DIAGNOSIS — Z91013 Allergy to seafood: Secondary | ICD-10-CM | POA: Insufficient documentation

## 2016-09-13 DIAGNOSIS — Z86718 Personal history of other venous thrombosis and embolism: Secondary | ICD-10-CM | POA: Insufficient documentation

## 2016-09-13 DIAGNOSIS — Z90721 Acquired absence of ovaries, unilateral: Secondary | ICD-10-CM | POA: Diagnosis not present

## 2016-09-13 DIAGNOSIS — N83299 Other ovarian cyst, unspecified side: Secondary | ICD-10-CM | POA: Diagnosis not present

## 2016-09-13 DIAGNOSIS — F419 Anxiety disorder, unspecified: Secondary | ICD-10-CM | POA: Diagnosis not present

## 2016-09-13 DIAGNOSIS — Z79899 Other long term (current) drug therapy: Secondary | ICD-10-CM | POA: Diagnosis not present

## 2016-09-13 DIAGNOSIS — D071 Carcinoma in situ of vulva: Secondary | ICD-10-CM | POA: Diagnosis not present

## 2016-09-13 DIAGNOSIS — Z8249 Family history of ischemic heart disease and other diseases of the circulatory system: Secondary | ICD-10-CM | POA: Diagnosis not present

## 2016-09-13 DIAGNOSIS — N83291 Other ovarian cyst, right side: Secondary | ICD-10-CM | POA: Insufficient documentation

## 2016-09-13 DIAGNOSIS — Z885 Allergy status to narcotic agent status: Secondary | ICD-10-CM | POA: Diagnosis not present

## 2016-09-13 DIAGNOSIS — I10 Essential (primary) hypertension: Secondary | ICD-10-CM | POA: Diagnosis not present

## 2016-09-13 DIAGNOSIS — L900 Lichen sclerosus et atrophicus: Secondary | ICD-10-CM

## 2016-09-13 DIAGNOSIS — G8929 Other chronic pain: Secondary | ICD-10-CM | POA: Insufficient documentation

## 2016-09-13 NOTE — Patient Instructions (Signed)
Preparing for your Surgery  Plan for surgery on September 26, 2016 with Dr. Everitt Amber Planned procedure is a wide local excision of vulva  Pre-operative Testing -You will receive a phone call from presurgical testing at [facility] to arrange for a pre-operative testing appointment before your surgery.  This appointment normally occurs one to two weeks before your scheduled surgery.   -Bring your insurance card, copy of an advanced directive if applicable, medication list  -At that visit, you will be asked to sign a consent for a possible blood transfusion in case a transfusion becomes necessary during surgery.  The need for a blood transfusion is rare but having consent is a necessary part of your care.     -You should not be taking blood thinners or aspirin at least ten days prior to surgery unless instructed by your surgeon.  Day Before Surgery at [place] will be asked to take in a light diet the day before surgery.  Avoid carbonated beverages.  You will be advised to have nothing to eat or drink after midnight the evening before.     Eat a light diet the day before surgery.  Examples including soups, broths,  toast, yogurt, mashed potatoes.  Things to avoid include carbonated beverages  (fizzy beverages), raw fruits and raw vegetables, or beans.    If your bowels are filled with gas, your surgeon will have difficulty  visualizing your pelvic organs which increases your surgical risks.  Your role in recovery Your role is to become active as soon as directed by your doctor, while still giving yourself time to heal.  Rest when you feel tired. You will be asked to do the following in order to speed your recovery:  - Cough and breathe deeply. This helps toclear and expand your lungs and can prevent pneumonia. You may be given a spirometer to practice deep breathing. A staff member will show you how to use the spirometer. - Do mild physical activity. Walking or moving your  legs help your circulation and body functions return to normal. A staff member will help you when you try to walk and will provide you with simple exercises. Do not try to get up or walk alone the first time. - Actively manage your pain. Managing your pain lets you move in comfort. We will ask you to rate your pain on a scale of zero to 10. It is your responsibility to tell your doctor or nurse where and how much you hurt so your pain can be treated.  Special Considerations -If you are diabetic, you may be placed on insulin after surgery to have closer control over your blood sugars to promote healing and recovery.  This does not mean that you will be discharged on insulin.  If applicable, your oral antidiabetics will be resumed when you are tolerating a solid diet.  -Your final pathology results from surgery should be available by the Friday after surgery and the results will be relayed to you when available.  Eat a light diet the day before surgery.  Examples including soups, broths, toast, yogurt, mashed potatoes.  Things to avoid include carbonated beverages (fizzy beverages), raw fruits and raw vegetables, or beans.   If your bowels are filled with gas, your surgeon will have difficulty visualizing your pelvic organs which increases your surgical risks. Blood Transfusion Information WHAT IS A BLOOD TRANSFUSION? A transfusion is the replacement of blood or some of its parts. Blood is made up of multiple  cells which provide different functions.  Red blood cells carry oxygen and are used for blood loss replacement.  White blood cells fight against infection.  Platelets control bleeding.  Plasma helps clot blood.  Other blood products are available for specialized needs, such as hemophilia or other clotting disorders. BEFORE THE TRANSFUSION  Who gives blood for transfusions?   You may be able to donate blood to be used at a later date on yourself (autologous donation).  Relatives can be  asked to donate blood. This is generally not any safer than if you have received blood from a stranger. The same precautions are taken to ensure safety when a relative's blood is donated.  Healthy volunteers who are fully evaluated to make sure their blood is safe. This is blood bank blood. Transfusion therapy is the safest it has ever been in the practice of medicine. Before blood is taken from a donor, a complete history is taken to make sure that person has no history of diseases nor engages in risky social behavior (examples are intravenous drug use or sexual activity with multiple partners). The donor's travel history is screened to minimize risk of transmitting infections, such as malaria. The donated blood is tested for signs of infectious diseases, such as HIV and hepatitis. The blood is then tested to be sure it is compatible with you in order to minimize the chance of a transfusion reaction. If you or a relative donates blood, this is often done in anticipation of surgery and is not appropriate for emergency situations. It takes many days to process the donated blood. RISKS AND COMPLICATIONS Although transfusion therapy is very safe and saves many lives, the main dangers of transfusion include:   Getting an infectious disease.  Developing a transfusion reaction. This is an allergic reaction to something in the blood you were given. Every precaution is taken to prevent this. The decision to have a blood transfusion has been considered carefully by your caregiver before blood is given. Blood is not given unless the benefits outweigh the risks.

## 2016-09-19 ENCOUNTER — Encounter (HOSPITAL_BASED_OUTPATIENT_CLINIC_OR_DEPARTMENT_OTHER): Payer: Self-pay | Admitting: *Deleted

## 2016-09-19 NOTE — Progress Notes (Signed)
NPO AFTER MN W/ EXCEPTION CLEAR LIQUIDS UNTIL 0600 (NO CREAM / MILK PRODUCTS).  ARRIVE AT 1030.  NEEDS ISTAT 8 AND EKG. WILL TAKE ALIGN AM DOS W/ SIPS OF WATER AND IF NEEDED TAKE TRAMADOL.

## 2016-09-25 ENCOUNTER — Telehealth: Payer: Self-pay | Admitting: *Deleted

## 2016-09-25 NOTE — Telephone Encounter (Signed)
Received disability forms via fax. I have filled out the form for managed care and place in the box.

## 2016-09-26 ENCOUNTER — Ambulatory Visit (HOSPITAL_BASED_OUTPATIENT_CLINIC_OR_DEPARTMENT_OTHER): Payer: BC Managed Care – PPO | Admitting: Anesthesiology

## 2016-09-26 ENCOUNTER — Other Ambulatory Visit: Payer: Self-pay

## 2016-09-26 ENCOUNTER — Ambulatory Visit (HOSPITAL_BASED_OUTPATIENT_CLINIC_OR_DEPARTMENT_OTHER)
Admission: RE | Admit: 2016-09-26 | Discharge: 2016-09-26 | Disposition: A | Discharge status: Home / Self Care | Payer: BC Managed Care – PPO | Source: Ambulatory Visit | Attending: Gynecologic Oncology | Admitting: Gynecologic Oncology

## 2016-09-26 ENCOUNTER — Encounter (HOSPITAL_BASED_OUTPATIENT_CLINIC_OR_DEPARTMENT_OTHER)
Admission: RE | Disposition: A | Discharge status: Home / Self Care | Payer: Self-pay | Source: Ambulatory Visit | Attending: Gynecologic Oncology

## 2016-09-26 ENCOUNTER — Encounter (HOSPITAL_BASED_OUTPATIENT_CLINIC_OR_DEPARTMENT_OTHER): Payer: Self-pay

## 2016-09-26 DIAGNOSIS — Z86718 Personal history of other venous thrombosis and embolism: Secondary | ICD-10-CM | POA: Diagnosis not present

## 2016-09-26 DIAGNOSIS — D071 Carcinoma in situ of vulva: Secondary | ICD-10-CM | POA: Diagnosis not present

## 2016-09-26 DIAGNOSIS — N8301 Follicular cyst of right ovary: Secondary | ICD-10-CM | POA: Diagnosis not present

## 2016-09-26 DIAGNOSIS — N894 Leukoplakia of vagina: Secondary | ICD-10-CM | POA: Diagnosis not present

## 2016-09-26 DIAGNOSIS — I10 Essential (primary) hypertension: Secondary | ICD-10-CM | POA: Diagnosis not present

## 2016-09-26 DIAGNOSIS — Z79899 Other long term (current) drug therapy: Secondary | ICD-10-CM | POA: Diagnosis not present

## 2016-09-26 HISTORY — DX: Other specified disorders of muscle: M62.89

## 2016-09-26 HISTORY — PX: VULVECTOMY: SHX1086

## 2016-09-26 HISTORY — DX: Unspecified ovarian cyst, right side: N83.201

## 2016-09-26 HISTORY — DX: Other specified conditions associated with female genital organs and menstrual cycle: N94.89

## 2016-09-26 HISTORY — DX: Personal history of other infectious and parasitic diseases: Z86.19

## 2016-09-26 HISTORY — DX: Adverse effect of unspecified anesthetic, initial encounter: T41.45XA

## 2016-09-26 HISTORY — DX: Other asthma: J45.998

## 2016-09-26 HISTORY — DX: Lichen simplex chronicus: L28.0

## 2016-09-26 HISTORY — DX: Other complications of anesthesia, initial encounter: T88.59XA

## 2016-09-26 HISTORY — DX: Personal history of other benign neoplasm: Z86.018

## 2016-09-26 HISTORY — DX: Carcinoma in situ of vulva: D07.1

## 2016-09-26 LAB — POCT I-STAT 4, (NA,K, GLUC, HGB,HCT)
Glucose, Bld: 90 mg/dL (ref 65–99)
HCT: 40 % (ref 36.0–46.0)
HEMOGLOBIN: 13.6 g/dL (ref 12.0–15.0)
POTASSIUM: 3.8 mmol/L (ref 3.5–5.1)
Sodium: 143 mmol/L (ref 135–145)

## 2016-09-26 SURGERY — WIDE EXCISION VULVECTOMY
Anesthesia: General

## 2016-09-26 MED ORDER — FENTANYL CITRATE (PF) 100 MCG/2ML IJ SOLN
INTRAMUSCULAR | Status: AC
  Filled 2016-09-26: qty 2

## 2016-09-26 MED ORDER — LIDOCAINE 2% (20 MG/ML) 5 ML SYRINGE
INTRAMUSCULAR | Status: AC
  Filled 2016-09-26: qty 5

## 2016-09-26 MED ORDER — LACTATED RINGERS IV SOLN
INTRAVENOUS | Status: DC
Start: 2016-09-26 — End: 2016-09-26
  Administered 2016-09-26: 11:00:00 via INTRAVENOUS
  Filled 2016-09-26: qty 1000

## 2016-09-26 MED ORDER — LIDOCAINE 2% (20 MG/ML) 5 ML SYRINGE
INTRAMUSCULAR | Status: DC | PRN
  Administered 2016-09-26: 100 mg via INTRAVENOUS

## 2016-09-26 MED ORDER — PROPOFOL 10 MG/ML IV BOLUS
INTRAVENOUS | Status: AC
Start: 2016-09-26 — End: 2016-09-26
  Filled 2016-09-26: qty 20

## 2016-09-26 MED ORDER — ONDANSETRON HCL 4 MG/2ML IJ SOLN
INTRAMUSCULAR | Status: DC | PRN
  Administered 2016-09-26: 4 mg via INTRAVENOUS

## 2016-09-26 MED ORDER — PROPOFOL 10 MG/ML IV BOLUS
INTRAVENOUS | Status: DC | PRN
  Administered 2016-09-26: 240 mg via INTRAVENOUS

## 2016-09-26 MED ORDER — FENTANYL CITRATE (PF) 100 MCG/2ML IJ SOLN
25.0000 ug | INTRAMUSCULAR | Status: DC | PRN
  Administered 2016-09-26: 25 ug via INTRAVENOUS
  Filled 2016-09-26: qty 1

## 2016-09-26 MED ORDER — MIDAZOLAM HCL 5 MG/5ML IJ SOLN
INTRAMUSCULAR | Status: DC | PRN
  Administered 2016-09-26: 2 mg via INTRAVENOUS

## 2016-09-26 MED ORDER — ACETAMINOPHEN 325 MG PO TABS
325.0000 mg | ORAL_TABLET | ORAL | Status: DC | PRN
  Filled 2016-09-26: qty 2

## 2016-09-26 MED ORDER — MEPERIDINE HCL 25 MG/ML IJ SOLN
6.2500 mg | INTRAMUSCULAR | Status: DC | PRN
  Filled 2016-09-26: qty 1

## 2016-09-26 MED ORDER — MIDAZOLAM HCL 2 MG/2ML IJ SOLN
INTRAMUSCULAR | Status: AC
  Filled 2016-09-26: qty 2

## 2016-09-26 MED ORDER — ACETAMINOPHEN-CODEINE #3 300-30 MG PO TABS: 1.0000 | ORAL_TABLET | ORAL | 0 refills | Status: DC | PRN

## 2016-09-26 MED ORDER — ONDANSETRON HCL 4 MG/2ML IJ SOLN
INTRAMUSCULAR | Status: AC
  Filled 2016-09-26: qty 2

## 2016-09-26 MED ORDER — LIDOCAINE HCL 1 % IJ SOLN
INTRAMUSCULAR | Status: DC | PRN
  Administered 2016-09-26: 6 mL

## 2016-09-26 MED ORDER — SENNA 8.6 MG PO TABS: 1.0000 | ORAL_TABLET | Freq: Every day | ORAL | 0 refills | Status: DC

## 2016-09-26 MED ORDER — KETOROLAC TROMETHAMINE 30 MG/ML IJ SOLN
30.0000 mg | Freq: Once | INTRAMUSCULAR | Status: DC | PRN
  Filled 2016-09-26: qty 1

## 2016-09-26 MED ORDER — ACETAMINOPHEN 160 MG/5ML PO SOLN
325.0000 mg | ORAL | Status: DC | PRN
  Filled 2016-09-26: qty 20.3

## 2016-09-26 MED ORDER — DEXAMETHASONE SODIUM PHOSPHATE 10 MG/ML IJ SOLN
INTRAMUSCULAR | Status: AC
  Filled 2016-09-26: qty 1

## 2016-09-26 MED ORDER — ONDANSETRON HCL 4 MG/2ML IJ SOLN
4.0000 mg | Freq: Once | INTRAMUSCULAR | Status: DC | PRN
  Filled 2016-09-26: qty 2

## 2016-09-26 MED ORDER — FENTANYL CITRATE (PF) 100 MCG/2ML IJ SOLN
INTRAMUSCULAR | Status: DC | PRN
  Administered 2016-09-26: 50 ug via INTRAVENOUS
  Administered 2016-09-26: 25 ug via INTRAVENOUS

## 2016-09-26 MED ORDER — DEXAMETHASONE SODIUM PHOSPHATE 4 MG/ML IJ SOLN
INTRAMUSCULAR | Status: DC | PRN
  Administered 2016-09-26: 10 mg via INTRAVENOUS

## 2016-09-26 SURGICAL SUPPLY — 46 items
APPLICATOR COTTON TIP 6IN STRL (MISCELLANEOUS) IMPLANT
BLADE CLIPPER SURG (BLADE) IMPLANT
BLADE SURG 15 STRL LF DISP TIS (BLADE) ×1 IMPLANT
BLADE SURG 15 STRL SS (BLADE) ×1
BRIEF STRETCH FOR OB PAD LRG (UNDERPADS AND DIAPERS) ×2 IMPLANT
CANISTER SUCT 3000ML PPV (MISCELLANEOUS) ×2 IMPLANT
CATH FOLEY 2WAY SLVR  5CC 14FR (CATHETERS)
CATH FOLEY 2WAY SLVR 5CC 14FR (CATHETERS) IMPLANT
CATH ROBINSON RED A/P 14FR (CATHETERS) ×2 IMPLANT
COVER BACK TABLE 60X90IN (DRAPES) ×2 IMPLANT
DRAPE LG THREE QUARTER DISP (DRAPES) ×2 IMPLANT
DRAPE UNDERBUTTOCKS STRL (DRAPE) ×2 IMPLANT
ELECT REM PT RETURN 9FT ADLT (ELECTROSURGICAL) ×2
ELECTRODE REM PT RTRN 9FT ADLT (ELECTROSURGICAL) ×1 IMPLANT
GAUZE SPONGE 4X4 16PLY XRAY LF (GAUZE/BANDAGES/DRESSINGS) IMPLANT
GLOVE BIO SURGEON STRL SZ 6 (GLOVE) ×4 IMPLANT
KIT RM TURNOVER CYSTO AR (KITS) ×2 IMPLANT
LEGGING LITHOTOMY PAIR STRL (DRAPES) ×2 IMPLANT
NEEDLE HYPO 25X1 1.5 SAFETY (NEEDLE) ×2 IMPLANT
NS IRRIG 500ML POUR BTL (IV SOLUTION) ×2 IMPLANT
PACK BASIN DAY SURGERY FS (CUSTOM PROCEDURE TRAY) ×2 IMPLANT
PAD OB MATERNITY 4.3X12.25 (PERSONAL CARE ITEMS) ×2 IMPLANT
PAD PREP 24X48 CUFFED NSTRL (MISCELLANEOUS) ×2 IMPLANT
PENCIL BUTTON HOLSTER BLD 10FT (ELECTRODE) ×2 IMPLANT
SCOPETTES 8  STERILE (MISCELLANEOUS)
SCOPETTES 8 STERILE (MISCELLANEOUS) IMPLANT
SUT VIC AB 0 SH 27 (SUTURE) ×2 IMPLANT
SUT VIC AB 2-0 CT2 27 (SUTURE) IMPLANT
SUT VIC AB 2-0 SH 27 (SUTURE)
SUT VIC AB 2-0 SH 27XBRD (SUTURE) IMPLANT
SUT VIC AB 3-0 PS2 18 (SUTURE)
SUT VIC AB 3-0 PS2 18XBRD (SUTURE) IMPLANT
SUT VIC AB 3-0 SH 27 (SUTURE) ×3
SUT VIC AB 3-0 SH 27X BRD (SUTURE) ×3 IMPLANT
SUT VIC AB 4-0 PS2 18 (SUTURE) ×8 IMPLANT
SUT VICRYL 2 0 18  UND BR (SUTURE)
SUT VICRYL 2 0 18 UND BR (SUTURE) IMPLANT
SUT VICRYL 4-0 PS2 18IN ABS (SUTURE) ×2 IMPLANT
SYR BULB IRRIGATION 50ML (SYRINGE) ×2 IMPLANT
SYR CONTROL 10ML LL (SYRINGE) ×2 IMPLANT
TOWEL OR 17X24 6PK STRL BLUE (TOWEL DISPOSABLE) ×2 IMPLANT
TRAY DSU PREP LF (CUSTOM PROCEDURE TRAY) ×2 IMPLANT
TUBE CONNECTING 12X1/4 (SUCTIONS) ×2 IMPLANT
VACUUM HOSE/TUBING 7/8INX6FT (MISCELLANEOUS) IMPLANT
WATER STERILE IRR 500ML POUR (IV SOLUTION) ×2 IMPLANT
YANKAUER SUCT BULB TIP NO VENT (SUCTIONS) ×2 IMPLANT

## 2016-09-26 NOTE — Transfer of Care (Signed)
Immediate Anesthesia Transfer of Care Note  Patient: [name]  Procedure(s) Performed: Procedure(s) (LRB): WIDE LOCAL EXCISION OF VULVA (N/A)  Patient Location: PACU  Anesthesia Type: General  Level of Consciousness: awake, oriented, sedated and patient cooperative  Airway & Oxygen Therapy: Patient Spontanous Breathing and Patient connected to face mask oxygen  Post-op Assessment: Report given to PACU RN and Post -op Vital signs reviewed and stable  Post vital signs: Reviewed and stable  Complications: No apparent anesthesia complications Last Vitals:  Vitals:   09/26/16 1030 09/26/16 1328  BP: 128/88 (!) 158/98  Pulse:  89  Resp: 19 12  Temp: 36.7 C 36.4 C

## 2016-09-26 NOTE — Anesthesia Preprocedure Evaluation (Signed)
Anesthesia Evaluation  Patient identified by MRN, date of birth, ID band Patient awake    Airway Mallampati: I       Dental no notable dental hx.    Pulmonary    Pulmonary exam normal        Cardiovascular hypertension, Normal cardiovascular exam     Neuro/Psych    GI/Hepatic negative GI ROS, Neg liver ROS,   Endo/Other    Renal/GU negative Renal ROS     Musculoskeletal   Abdominal (+) + obese,   Peds  Hematology negative hematology ROS (+)   Anesthesia Other Findings   Reproductive/Obstetrics                             Anesthesia Physical Anesthesia Plan  ASA: II  Anesthesia Plan: General   Post-op Pain Management:    Induction: Intravenous  Airway Management Planned: LMA  Additional Equipment:   Intra-op Plan:   Post-operative Plan:   Informed Consent: I have reviewed the patients History and Physical, chart, labs and discussed the procedure including the risks, benefits and alternatives for the proposed anesthesia with the patient or authorized representative who has indicated his/her understanding and acceptance.     Plan Discussed with: CRNA and Surgeon  Anesthesia Plan Comments:         Anesthesia Quick Evaluation

## 2016-09-26 NOTE — Anesthesia Postprocedure Evaluation (Signed)
Anesthesia Post Note  Patient: [name]  Procedure(s) Performed: Procedure(s) (LRB): WIDE LOCAL EXCISION OF VULVA (N/A)  Patient location during evaluation: PACU Anesthesia Type: General Level of consciousness: awake and alert Pain management: pain level controlled Vital Signs Assessment: post-procedure vital signs reviewed and stable Respiratory status: spontaneous breathing, nonlabored ventilation, respiratory function stable and patient connected to nasal cannula oxygen Cardiovascular status: blood pressure returned to baseline and stable Postop Assessment: no signs of nausea or vomiting Anesthetic complications: no       Last Vitals:  Vitals:   09/26/16 1328 09/26/16 1330  BP: (!) 158/98 (!) 117/53  Pulse: 89 83  Resp: 12 13  Temp: 36.4 C     Last Pain:  Vitals:   09/26/16 1030  TempSrc: Oral                 Jamelah Sitzer DAVID

## 2016-09-26 NOTE — Interval H&P Note (Signed)
History and Physical Interval Note:  09/26/2016 11:49 AM  [name]  has presented today for surgery, with the diagnosis of VIN 3  The various methods of treatment have been discussed with the patient and family. After consideration of risks, benefits and other options for treatment, the patient has consented to  Procedure(s): WIDE LOCAL EXCISION OF VULVA (N/A) as a surgical intervention .  The patient's history has been reviewed, patient examined, no change in status, stable for surgery.  I have reviewed the patient's chart and labs.  Questions were answered to the patient's satisfaction.     Donaciano Eva

## 2016-09-26 NOTE — Anesthesia Procedure Notes (Signed)
Procedure Name: LMA Insertion Date/Time: 09/26/2016 12:54 PM Performed by: Denna Haggard D Pre-anesthesia Checklist: Patient identified, Emergency Drugs available, Suction available and Patient being monitored Patient Re-evaluated:Patient Re-evaluated prior to inductionOxygen Delivery Method: Circle system utilized Preoxygenation: Pre-oxygenation with 100% oxygen Intubation Type: IV induction Ventilation: Mask ventilation without difficulty LMA: LMA inserted LMA Size: 4.0 Number of attempts: 1 Airway Equipment and Method: Bite block Placement Confirmation: positive ETCO2 Tube secured with: Tape Dental Injury: Teeth and Oropharynx as per pre-operative assessment

## 2016-09-26 NOTE — Discharge Instructions (Signed)
Post Anesthesia Home Care Instructions  Activity: Get plenty of rest for the remainder of the day. A responsible individual must stay with you for 24 hours following the procedure.  For the next 24 hours, DO NOT: -Drive a car -Paediatric nurse -Drink alcoholic beverages -Take any medication unless instructed by your physician -Make any legal decisions or sign important papers.  Meals: Start with liquid foods such as gelatin or soup. Progress to regular foods as tolerated. Avoid greasy, spicy, heavy foods. If nausea and/or vomiting occur, drink only clear liquids until the nausea and/or vomiting subsides. Call your physician if vomiting continues.  Special Instructions/Symptoms: Your throat may feel dry or sore from the anesthesia or the breathing tube placed in your throat during surgery. If this causes discomfort, gargle with warm salt water. The discomfort should disappear within 24 hours.  If you had a scopolamine patch placed behind your ear for the management of post- operative nausea and/or vomiting:  1. The medication in the patch is effective for 72 hours, after which it should be removed.  Wrap patch in a tissue and discard in the trash. Wash hands thoroughly with soap and water. 2. You may remove the patch earlier than 72 hours if you experience unpleasant side effects which may include dry mouth, dizziness or visual disturbances. 3. Avoid touching the patch. Wash your hands with soap and water after contact with the patch.   Vulvectomy, Care After The vulva is the external female genitalia, outside and around the vagina and pubic bone. It consists of:  The skin on, and in front of, the pubic bone.  The clitoris.  The labia majora (large lips) on the outside of the vagina.  The labia minora (small lips) around the opening of the vagina.  The opening and the skin in and around the vagina. A vulvectomy is the removal of the tissue of the vulva, which sometimes includes  removal of the lymph nodes and tissue in the groin areas. These discharge instructions provide you with general information on caring for yourself after you leave the hospital. It is also important that you know the warning signs of complications, so that you can seek treatment. Please read the instructions outlined below and refer to this sheet in the next few weeks. Your caregiver may also give you specific information and medicines. If you have any questions or complications after discharge, please call your caregiver. ACTIVITY  Rest as much as possible the first two weeks after discharge.  Arrange to have help from family or others with your daily activities when you go home.  Avoid heavy lifting (more than 5 pounds), pushing, or pulling.  If you feel tired, balance your activity with rest periods.  Follow your caregiver's instruction about climbing stairs and driving a car.  Increase activity gradually.  Do not exercise until you have permission from your caregiver. LEG AND FOOT CARE If your doctor has removed lymph nodes from your groin area, there may be an increase in swelling of your legs and feet. You can help prevent swelling by doing the following:  Elevate your legs while sitting or lying down.  If your caregiver has ordered special stockings, wear them according to instructions.  Avoid standing in one place for long periods of time.  Call the physical therapy department if you have any questions about swelling or treatment for swelling.  Avoid salt in your diet. It can cause fluid retention and swelling.  Do not cross your legs, especially when  sitting. NUTRITION  You may resume your normal diet.  Drink 6 to 8 glasses of fluids a day.  Eat a healthy, balanced diet including portions of food from the meat (protein), milk, fruit, vegetable, and bread groups.  Your caregiver may recommend you take a multivitamin with iron. ELIMINATION  You may notice that your  stream of urine is at a different angle, and may tend to spray. Using a plastic funnel may help to decrease urine spray.  If constipation occurs, drink more liquids, and add more fruits, vegetables, and bran to your diet. You may take a mild laxative, such as Milk of Magnesia, Metamucil, or a stool softener such as Colace, with permission from your caregiver. HYGIENE  You may shower and wash your hair.  Check with your caregiver about tub baths.  Do not add any bath oils or chemicals to your bath water, after you have permission to take baths.  While passing urine, pour water from a bottle or spray over your vulva to dilute the urine as it passes the incision (this will decrease burning and discomfort).  Clean yourself well after moving your bowels.  After urinating, do not wipe. Dap or pat dry with toilet paper or a dry cleath soft cloth.  A sitz bath will help keep your perineal area clean, reduce swelling, and provide comfort.  Avoid wearing underpants for the first 2 weeks and wear loose skirts to allow circulation of air around the incision  You do not need to apply dressings, salves or lotions to the wound.  The stitches are self-dissolving and will absorb and disappear over a couple of months (it is normal to notice the knot from the stitches on toilet paper after voiding). HOME CARE INSTRUCTIONS   Apply a soft ice pack (or frozen bag of peas) to your perineum (vulva) every hour in the first 48 hours after surgery. This will reduce swelling.  Avoid activities that involve a lot of friction between your legs.  Avoid wearing pants or underpants in the 1st 2 weeks (skirts are preferable).  Take your temperature twice a day and record it, especially if you feel feverish or have chills.  Follow your caregiver's instructions about medicines, activity, and follow-up appointments after surgery.  Do not drink alcohol while taking pain medicine.  Change your dressing as advised by  your caregiver.  You may take over-the-counter medicine for pain, recommended by your caregiver.  If your pain is not relieved with medicine, call your caregiver.  Do not take aspirin because it can cause bleeding.  Do not douche or use tampons (use a nonperfumed sanitary pad).  Do not have sexual intercourse until your caregiver gives you permission (typically 6 weeks postoperatively). Hugging, kissing, and playful sexual activity is fine with your caregiver's permission.  Warm sitz baths, with your caregiver's permission, are helpful to control swelling and discomfort.  Take showers instead of baths, until your caregiver gives you permission to take baths.  You may take a mild medicine for constipation, recommended by your caregiver. Bran foods and drinking a lot of fluids will help with constipation.  Make sure your family understands everything about your operation and recovery. SEEK MEDICAL CARE IF:   You notice swelling and redness around the wound area.  You notice a foul smell coming from the wound or on the surgical dressing.  You notice the wound is separating.  You have painful or bloody urination.  You develop nausea and vomiting.  You develop diarrhea.  You develop a rash.  You have a reaction or allergy from the medicine.  You feel dizzy or light-headed.  You need stronger pain medicine. SEEK IMMEDIATE MEDICAL CARE IF:   You develop a temperature of 102 F (38.9 C) or higher.  You pass out.  You develop leg or chest pain.  You develop abdominal pain.  You develop shortness of breath.  You develop bleeding from the wound area.  You see pus in the wound area. MAKE SURE YOU:   Understand these instructions.  Will watch your condition.  Will get help right away if you are not doing well or get worse. Document Released: 01/25/2004 Document Revised: 10/27/2013 Document Reviewed: 05/14/2009 [facility] Patient Information 2015 [place], [place]. This  information is not intended to replace advice given to you by your health care provider. Make sure you discuss any questions you have with your health care provider.

## 2016-09-26 NOTE — Op Note (Signed)
PATIENT: [name] DATE: 09/26/16   Preop Diagnosis: VIN3  Postoperative Diagnosis: same  Surgery: Partial simple left vulvectomy  Surgeons:  Donaciano Eva, MD Assistant: none  Anesthesia: General with LMA  Estimated blood loss: <3ml  IVF:  248ml   Urine output: 50 ml   Complications: None   Pathology: left posterior vulva with marking stitch at 12 o'clock  Operative findings: 1cm area of unpigmented tissue (acetowhite staining) on posterior left labia majora  Procedure: The patient was identified in the preoperative holding area. Informed consent was signed on the chart. Patient was seen history was reviewed and exam was performed.   The patient was then taken to the operating room and placed in the supine position with SCD hose on. General anesthesia was then induced without difficulty. She was then placed in the dorsolithotomy position. The perineum was prepped with Betadine. The vagina was prepped with Betadine. The patient was then draped after the prep was dried. A Foley catheter was inserted into the bladder under sterile conditions.  Timeout was performed the patient, procedure, antibiotic, allergy, and length of procedure. 5% acetic acid solution was applied to the perineum. The vulvar tissues were inspected for areas of acetowhite changes or leukoplakia. The lesion was identified and the marking pen was used to circumscribe the area with appropriate surgical margins. The subcuticular tissues were infiltrated with 1% lidocaine. The 15 blade scalpel was used to make an incision through the skin circumferentially as marked. The skin elipse was grasped and was separated from the underlying deep dermal tissues with the bovie device. After the specimen had been completely resected, it was oriented and marked at 12 o'clock with a 0-vicryl suture. The bovie was used to obtain hemostasis at the surgical bed. The subcutaneous tissues were irrigated and made  hemostatic.   The deep dermal layer was approximated with 3-0vicryl mattress sutures to bring the skin edges into approximation and off tension. The wound was closed following langher's lines. The cutaneous layer was closed with interrupted 4-0 vicryl stitches and mattress sutures to ensure a tension free and hemostatic closure. The perineum was again irrigated. The foley was removed.  All instrument, suture, laparotomy, Ray-Tec, and needle counts were correct x2. The patient tolerated the procedure well and was taken recovery room in stable condition. This is Everitt Amber dictating an operative note on [name].

## 2016-09-26 NOTE — H&P (View-Only) (Signed)
Consult Note: Gyn-Onc  Consult was requested by Dr. Leo Grosser for the evaluation of [name] 51 y.o. female  CC:  Chief Complaint  Patient presents with  . Lichen Sclerosus  . Complex Ovarian Cyst   Assessment/Plan:  Ms. JALEXA Gregory  is a 51 y.o.  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 I recommend surgical excision with wide local excision of the vulva. I explained the operative risks, particularly infection, wound separation, scarring. I discussed anticipated leave from work (2 weeks) and anticipated recovery. We reviewed instructions for perineal care postop.  Lichen simplex chronicus: I recommend a course of clobetasol after she has completed and healed from surgery.  Right ovarian cyst:  Based on the description of the cyst in the referral notes and the fact that it is reducing in size, I have a low suspicion for malignancy. Given her underlying chronic pain and diagnosed pelvic flood dysfunction, she is not a good candidate for surgical intervention for pain.  HPI:   [name] is a 51 year old woman who is seen in consultation at the request of Dr Leo Grosser for VIN 3, lichen simplex chronicus and a complex right ovarian cyst.  The patient has a long history of PCOS and has formed multiple cysts on her ovaries.  She has been recently followed for a right ovarian simple cyst which resolved and a 2.3cm complex hemorrhagic cyst which is also resolving.  At the time of the exam (on 08/03/16) she was noted to have a lesion on the left posterior vulva/buttocks. The patient had felt irritation in that area for approximately 9 months to 1 year. She is a nonsmoker with no recent hx of HPV related disease.  A biopsy was taken of the lesion and the perianal skin. The vulva/buttock skin on the left was resulted as VIN III and the left  perianal lesion was lichen simplex chronicus.   Current Meds:  Outpatient Encounter Prescriptions as of 09/13/2016  Medication Sig  . diazepam (VALIUM) 10 MG tablet Insert 1 tablet into vagina every 8 hours as needed for pelvic floor spasm  . albuterol (PROVENTIL HFA;VENTOLIN HFA) 108 (90 BASE) MCG/ACT inhaler Inhale 2 puffs into the lungs every 6 (six) hours as needed for wheezing or shortness of breath.  Marland Kitchen CALCIUM-MAGNESIUM-ZINC PO Take 1 tablet by mouth daily.  . cholecalciferol (VITAMIN D) 1000 UNITS tablet Take 1,000 Units by mouth daily.  . Cyanocobalamin (RA VITAMIN B-12 TR) 1000 MCG TBCR Take by mouth.  . dicyclomine (BENTYL) 20 MG tablet Take 1 tablet (20 mg total) by mouth 2 (two) times daily.  Marland Kitchen losartan-hydrochlorothiazide (HYZAAR) 100-25 MG per tablet Take 1 tablet by mouth daily.  . potassium chloride (MICRO-K) 10 MEQ CR capsule Take 5 mEq by mouth daily.  . traMADol (ULTRAM) 50 MG tablet Take 1-2 tablets (50-100 mg total) by mouth every 4 (four) hours as needed for moderate pain.  . vitamin E 1000 UNIT capsule Take by mouth.  . [DISCONTINUED] ELMIRON 100 MG capsule Take 1 capsule by mouth 3 (three) times daily.  . [DISCONTINUED] omeprazole (PRILOSEC) 20 MG capsule Take 1 capsule (20 mg total) by mouth daily.  . [DISCONTINUED] vitamin B-12 (CYANOCOBALAMIN) 1000 MCG tablet Take 1,000 mcg by mouth daily.   No facility-administered encounter medications on file as of 09/13/2016.  Allergy:  Allergies  Allergen Reactions  . Doxycycline Nausea And Vomiting  . Hydrocodone Itching  . Percocet [Oxycodone-Acetaminophen] Itching  . Iodine Rash    Contraindicated due to shellfish allergy.  Marland Kitchen Shellfish Allergy Rash    Social Hx:   Social History   Social History  . Marital status: Married    Spouse name: N/A  . Number of children: N/A  . Years of education: N/A   Occupational History  . Not on file.   Social History Main Topics  . Smoking status: Never Smoker  .  Smokeless tobacco: Never Used  . Alcohol use No  . Drug use: No  . Sexual activity: Yes    Partners: Male    Birth control/ protection: Surgical     Comment: HYST    Other Topics Concern  . Not on file   Social History Narrative  . No narrative on file    Past Surgical Hx:  Past Surgical History:  Procedure Laterality Date  . ABDOMINAL HYSTERECTOMY    . BREAST SURGERY  2004   BREAST REDUCTION  . CARPAL TUNNEL RELEASE  2009    RIGHT WRIST  . CARPAL TUNNEL RELEASE  2010   LEFT WRIST  . DILATION AND CURETTAGE OF UTERUS    . LAPAROSCOPIC SALPINGO OOPHERECTOMY Left 01/12/2015   Procedure: LAPAROSCOPY;  Surgeon: Eldred Manges, MD;  Location: [facility];  Service: Gynecology;  Laterality: Left;  . LAPAROTOMY N/A 01/12/2015   Procedure: LAPAROTOMY;  Surgeon: Eldred Manges, MD;  Location: [facility];  Service: Gynecology;  Laterality: N/A;  . OOPHORECTOMY Left 01/12/2015   Procedure: OOPHORECTOMY;  Surgeon: Eldred Manges, MD;  Location: [facility];  Service: Gynecology;  Laterality: Left;  . WISDOM TOOTH EXTRACTION      Past Medical Hx:  Past Medical History:  Diagnosis Date  . Anxiety   . ASCUS (atypical squamous cells of undetermined significance) on Pap smear   . Chronic female pelvic pain   . Contusion, breast   . DVT (deep venous thrombosis) ([place]   . Fibroids   . History of DVT (deep vein thrombosis) 2004  . History of ovarian cyst   . History of PID [place]  . HSV-2 infection   . Hypertension   . Infertility associated with anovulation   . PCOS (polycystic ovarian syndrome)   . Pelvic adhesions    HISTORY OF  . PID (pelvic inflammatory disease)     Past Gynecological History:  PCOS, pelvic floor dysfunction. No LMP recorded. Patient has had a hysterectomy.  Family Hx:  Family History  Problem Relation Age of Onset  . Cancer Paternal Grandmother 58    OVARIAN  . Diabetes Maternal Aunt   . Cancer Paternal Aunt 25    CERVICAL  . Stroke Maternal  Grandmother   . Hypertension Mother   . Hypertension Father     Review of Systems:  Constitutional  Feels well,    ENT Normal appearing ears and nares bilaterally Skin/Breast  No rash, sores, jaundice, itching, dryness Cardiovascular  No chest pain, shortness of breath, or edema  Pulmonary  No cough or wheeze.  Gastro Intestinal  No nausea, vomitting, or diarrhoea. No bright red blood per rectum, no abdominal pain, change in bowel movement, or constipation.  Genito Urinary  No frequency, urgency, dysuria, pelvic pain with micturition - intermitten Musculo Skeletal  No myalgia, arthralgia, joint swelling or pain  Neurologic  No weakness, numbness, change in gait,  Psychology  No depression, anxiety, insomnia.   Vitals:  Blood pressure 133/75, pulse 91, temperature 98.3 F (36.8 C), temperature source Oral, resp. rate 20, height 5' 2.99" (1.6 m), weight 199 lb 1.6 oz (90.3 kg).  Physical Exam: WD in NAD Neck  Supple NROM, without any enlargements.  Lymph Node Survey No cervical supraclavicular or inguinal adenopathy Cardiovascular  Pulse normal rate, regularity and rhythm. S1 and S2 normal.  Lungs  Clear to auscultation bilateraly, without wheezes/crackles/rhonchi. Good air movement.  Skin  No rash/lesions/breakdown  Psychiatry  Alert and oriented to person, place, and time  Abdomen  Normoactive bowel sounds, abdomen soft, non-tender and obese without evidence of hernia.  Back No CVA tenderness Genito Urinary  Vulva/vagina: 2cm leukoplakia on the left posterior vulva. Acetic acid applied to remaining vulva - no other lesions seen.   Rectal  deferred Extremities  No bilateral cyanosis, clubbing or edema.   Donaciano Eva, MD  09/13/2016, 9:40 AM

## 2016-09-27 ENCOUNTER — Telehealth: Payer: Self-pay | Admitting: *Deleted

## 2016-09-27 ENCOUNTER — Encounter: Payer: Self-pay | Admitting: *Deleted

## 2016-09-27 NOTE — Telephone Encounter (Signed)
I have called the patient regarding her work note. Patient will pick up work note when she picks up the [employer] papers. I have also moved her appt from 1000am on April 23 to 11:15am

## 2016-09-27 NOTE — Telephone Encounter (Signed)
Per staff message from Dr. Denman George I have called and scheduled patient post op appt. Patient stated that " I need a work note to return on 4/18 with no restriction." I told the patient that awe will call her when the note is ready.

## 2016-09-28 ENCOUNTER — Encounter (HOSPITAL_BASED_OUTPATIENT_CLINIC_OR_DEPARTMENT_OTHER): Payer: Self-pay | Admitting: Gynecologic Oncology

## 2016-10-02 ENCOUNTER — Telehealth: Payer: Self-pay | Admitting: *Deleted

## 2016-10-02 ENCOUNTER — Telehealth: Payer: Self-pay | Admitting: Gynecologic Oncology

## 2016-10-02 NOTE — Telephone Encounter (Signed)
Informed patient of results of VIN 3, no cancer. Negative margins.  Wound has separated. Recommended neosporin and sitz baths.  Donaciano Eva, MD

## 2016-10-02 NOTE — Telephone Encounter (Signed)
Returned patient call regarding her incision. Per patient "my stitches have fallen out of my incision. And I have spotting." Per Dr. Denman George and desk RN I have called the patient back. I explain "It is normal for the stitches to sometimes fall out and that we will not replace them. That the wound will heal on it's own. To keep the incision clean and dry. To watch for signs of infection, and cll [place] if she has issues before her next appt."

## 2016-10-04 ENCOUNTER — Telehealth: Payer: Self-pay

## 2016-10-04 NOTE — Telephone Encounter (Signed)
[name] states the the incision area is opening more. She is afebrile. No purulent drainage.  She is concerned that the area is not healing and she is scheduled to return to work on 10-11-16. She has been doing sitz baths and appling neosporin as directed. She would like Dr. Denman George to see the incision and make recommendations  for return to work and any further interventions to promote healing. Pt to come in at 1300 on Friday 10-06-16.

## 2016-10-06 ENCOUNTER — Encounter: Payer: Self-pay | Admitting: Gynecologic Oncology

## 2016-10-06 ENCOUNTER — Ambulatory Visit: Payer: BC Managed Care – PPO | Attending: Gynecologic Oncology | Admitting: Gynecologic Oncology

## 2016-10-06 VITALS — BP 120/74 | HR 78 | Temp 97.8°F | Resp 20 | Wt 199.2 lb

## 2016-10-06 DIAGNOSIS — Z881 Allergy status to other antibiotic agents status: Secondary | ICD-10-CM | POA: Diagnosis not present

## 2016-10-06 DIAGNOSIS — T8131XA Disruption of external operation (surgical) wound, not elsewhere classified, initial encounter: Secondary | ICD-10-CM

## 2016-10-06 DIAGNOSIS — E282 Polycystic ovarian syndrome: Secondary | ICD-10-CM | POA: Insufficient documentation

## 2016-10-06 DIAGNOSIS — Z91013 Allergy to seafood: Secondary | ICD-10-CM | POA: Insufficient documentation

## 2016-10-06 DIAGNOSIS — Z885 Allergy status to narcotic agent status: Secondary | ICD-10-CM | POA: Diagnosis not present

## 2016-10-06 DIAGNOSIS — Z86718 Personal history of other venous thrombosis and embolism: Secondary | ICD-10-CM | POA: Insufficient documentation

## 2016-10-06 DIAGNOSIS — D071 Carcinoma in situ of vulva: Secondary | ICD-10-CM

## 2016-10-06 DIAGNOSIS — Z79899 Other long term (current) drug therapy: Secondary | ICD-10-CM | POA: Insufficient documentation

## 2016-10-06 DIAGNOSIS — Z8249 Family history of ischemic heart disease and other diseases of the circulatory system: Secondary | ICD-10-CM | POA: Diagnosis not present

## 2016-10-06 DIAGNOSIS — I10 Essential (primary) hypertension: Secondary | ICD-10-CM | POA: Insufficient documentation

## 2016-10-06 DIAGNOSIS — Y838 Other surgical procedures as the cause of abnormal reaction of the patient, or of later complication, without mention of misadventure at the time of the procedure: Secondary | ICD-10-CM | POA: Insufficient documentation

## 2016-10-06 DIAGNOSIS — F419 Anxiety disorder, unspecified: Secondary | ICD-10-CM | POA: Insufficient documentation

## 2016-10-06 DIAGNOSIS — T8130XA Disruption of wound, unspecified, initial encounter: Secondary | ICD-10-CM | POA: Insufficient documentation

## 2016-10-06 DIAGNOSIS — L280 Lichen simplex chronicus: Secondary | ICD-10-CM

## 2016-10-06 NOTE — Progress Notes (Signed)
Follow-up Note: Gyn-Onc  Consult was initially requested by Dr. Leo Grosser for the evaluation of [name] 51 y.o. female  CC:  Chief Complaint  Patient presents with  . VIN lll   Assessment/Plan:  Ms. AIRICA SCHWARTZKOPF  is a 51 y.o.  year old with a history of left posterior VIN 3, a right ovarian cyst and lichen simplex chronicus. s/p wide local excision of the left posterior vulva on 09/26/16  Wound separation   HPI:   [name] is a 51 year old woman who is seen in consultation at the request of Dr Leo Grosser for VIN 3, lichen simplex chronicus and a complex right ovarian cyst.  The patient has a long history of PCOS and has formed multiple cysts on her ovaries.  She has been recently followed for a right ovarian simple cyst which resolved and a 2.3cm complex hemorrhagic cyst which is also resolving.  At the time of the exam (on 08/03/16) she was noted to have a lesion on the left posterior vulva/buttocks. The patient had felt irritation in that area for approximately 9 months to 1 year. She is a nonsmoker with no recent hx of HPV related disease.  A biopsy was taken of the lesion and the perianal skin. The vulva/buttock skin on the left was resulted as VIN III and the left perianal lesion was lichen simplex chronicus.   Interval Hx: On 09/26/16 she underwent wide local excision of the left posterior vulva/buttock. Final pathology revealed VIN III, with negative margins.  Postop she developed wound separation.  Current Meds:  Outpatient Encounter Prescriptions as of 10/06/2016  Medication Sig  . acetaminophen-codeine (TYLENOL #3) 300-30 MG tablet Take 1-2 tablets by mouth every 4 (four) hours as needed for moderate pain.  Marland Kitchen albuterol (PROVENTIL HFA;VENTOLIN HFA) 108 (90 BASE) MCG/ACT inhaler Inhale 2 puffs into the lungs every 6 (six) hours as needed for wheezing or shortness of breath.  Marland Kitchen CALCIUM-MAGNESIUM-ZINC PO Take 1 tablet by mouth daily.  . cholecalciferol (VITAMIN D) 1000  UNITS tablet Take 1,000 Units by mouth every morning.   . Cyanocobalamin (RA VITAMIN B-12 TR) 1000 MCG TBCR Take 1 tablet by mouth every morning.   . diazepam (VALIUM) 10 MG tablet Insert 1 tablet into vagina every 8 hours as needed for pelvic floor spasm  . losartan-hydrochlorothiazide (HYZAAR) 100-25 MG per tablet Take 1 tablet by mouth every morning.   . potassium chloride (MICRO-K) 10 MEQ CR capsule Take 5 mEq by mouth every morning.   . Probiotic Product (ALIGN) 4 MG CAPS Take 1 capsule by mouth every morning.  . senna (SENOKOT) 8.6 MG TABS tablet Take 1 tablet (8.6 mg total) by mouth at bedtime.  . traMADol (ULTRAM) 50 MG tablet Take by mouth every 4 (four) hours as needed.  . vitamin E 1000 UNIT capsule Take 1,000 Units by mouth every morning.    No facility-administered encounter medications on file as of 10/06/2016.     Allergy:  Allergies  Allergen Reactions  . Doxycycline Nausea And Vomiting  . Hydrocodone Itching  . Oxycodone Itching    percocet  . Shellfish Allergy Rash    Social Hx:   Social History   Social History  . Marital status: Married    Spouse name: N/A  . Number of children: N/A  . Years of education: N/A   Occupational History  . Not on file.   Social History Main Topics  . Smoking status: Never Smoker  . Smokeless tobacco: Never  Used  . Alcohol use No  . Drug use: No  . Sexual activity: Yes    Partners: Male    Birth control/ protection: Surgical     Comment: HYST    Other Topics Concern  . Not on file   Social History Narrative  . No narrative on file    Past Surgical Hx:  Past Surgical History:  Procedure Laterality Date  . ABDOMINAL HYSTERECTOMY  09/14/2005   w/  Bilateral Salpingectomy  . BREAST REDUCTION SURGERY Bilateral 07/29/2001  . CARPAL TUNNEL RELEASE Bilateral right 2009;   left 2010  . D & C HYSTEROSCOPY W/ RESECTION UTERINE FIBROIDS  02/02/2005  . DX LAPAROSCOPY W/ LYSIS ADHESIONS  1994;  1995;  1996  . LAPAROSCOPIC  SALPINGO OOPHERECTOMY Left 01/12/2015   Procedure: LAPAROSCOPY;  Surgeon: Eldred Manges, MD;  Location: [facility];  Service: Gynecology;  Laterality: Left;  . LAPAROTOMY N/A 01/12/2015   Procedure: LAPAROTOMY;  Surgeon: Eldred Manges, MD;  Location: [facility];  Service: Gynecology;  Laterality: N/A;  . OOPHORECTOMY Left 01/12/2015   Procedure: OOPHORECTOMY;  Surgeon: Eldred Manges, MD;  Location: [facility];  Service: Gynecology;  Laterality: Left;  . REPAIR TENDONS RIGHT WRIST W/ REVISION CARPAL TUNNEL RELEASE  2015  . VULVECTOMY N/A 09/26/2016   Procedure: WIDE LOCAL EXCISION OF VULVA;  Surgeon: Everitt Amber, MD;  Location[facility];  Service: Gynecology;  Laterality: N/A;  . WISDOM TOOTH EXTRACTION      Past Medical Hx:  Past Medical History:  Diagnosis Date  . Anxiety   . Chronic female pelvic pain   . Complication of anesthesia    HARD TO WAKE  . Cyst of right ovary   . High-tone pelvic floor dysfunction   . History of DVT (deep vein thrombosis)    left lower leg -- post op 02/ 2003 breast reduction surgery  . History of Helicobacter pylori infection   . History of ovarian cyst   . History of PID [place]  . History of uterine fibroid   . Hypertension   . Lichen simplex chronicus    via left perianal bx/ buttocks  . PCOS (polycystic ovarian syndrome)   . Pelvic adhesions    hx extensive s/p  lysis adhesions -- total x4  . Seasonal asthma   . VIN III (vulvar intraepithelial neoplasia III)     Past Gynecological History:  PCOS, pelvic floor dysfunction. No LMP recorded. Patient has had a hysterectomy.  Family Hx:  Family History  Problem Relation Age of Onset  . Cancer Paternal Grandmother 13    OVARIAN  . Diabetes Maternal Aunt   . Cancer Paternal Aunt 72    CERVICAL  . Stroke Maternal Grandmother   . Hypertension Mother   . Hypertension Father     Review of Systems:  Constitutional  Feels well,    ENT Normal appearing ears and nares  bilaterally Skin/Breast  No rash, sores, jaundice, itching, dryness Cardiovascular  No chest pain, shortness of breath, or edema  Pulmonary  No cough or wheeze.  Gastro Intestinal  No nausea, vomitting, or diarrhoea. No bright red blood per rectum, no abdominal pain, change in bowel movement, or constipation.  Genito Urinary  No frequency, urgency, dysuria, pelvic pain with micturition - intermitten Musculo Skeletal  No myalgia, arthralgia, joint swelling or pain  Neurologic  No weakness, numbness, change in gait,  Psychology  No depression, anxiety, insomnia.   Vitals:  Blood pressure 120/74,  pulse 78, temperature 97.8 F (36.6 C), temperature source Oral, resp. rate 20, weight 199 lb 3.2 oz (90.4 kg).  Physical Exam: WD in NAD Neck  Supple NROM, without any enlargements.  Lymph Node Survey No cervical supraclavicular or inguinal adenopathy Cardiovascular  Pulse normal rate, regularity and rhythm. S1 and S2 normal.  Lungs  Clear to auscultation bilateraly, without wheezes/crackles/rhonchi. Good air movement.  Skin  No rash/lesions/breakdown  Psychiatry  Alert and oriented to person, place, and time  Abdomen  Normoactive bowel sounds, abdomen soft, non-tender and obese without evidence of hernia.  Back No CVA tenderness Genito Urinary  Vulva/vagina: wound separation. Base of wound is clean and granulating in. The anterior 2cm of wound is in tact.   Rectal  deferred Extremities  No bilateral cyanosis, clubbing or edema.   Donaciano Eva, MD  10/06/2016, 12:26 PM

## 2016-10-06 NOTE — Patient Instructions (Signed)
Keep appointment on 10-16-16 with Dr. Denman George as scheduled.

## 2016-10-16 ENCOUNTER — Ambulatory Visit: Payer: BC Managed Care – PPO | Attending: Gynecologic Oncology | Admitting: Gynecologic Oncology

## 2016-10-16 ENCOUNTER — Encounter: Payer: Self-pay | Admitting: Gynecologic Oncology

## 2016-10-16 VITALS — BP 115/77 | HR 79 | Temp 99.1°F | Resp 20 | Ht 62.9 in | Wt 197.4 lb

## 2016-10-16 DIAGNOSIS — Z90710 Acquired absence of both cervix and uterus: Secondary | ICD-10-CM | POA: Insufficient documentation

## 2016-10-16 DIAGNOSIS — Z8041 Family history of malignant neoplasm of ovary: Secondary | ICD-10-CM | POA: Diagnosis not present

## 2016-10-16 DIAGNOSIS — Z881 Allergy status to other antibiotic agents status: Secondary | ICD-10-CM | POA: Insufficient documentation

## 2016-10-16 DIAGNOSIS — F419 Anxiety disorder, unspecified: Secondary | ICD-10-CM | POA: Insufficient documentation

## 2016-10-16 DIAGNOSIS — Z9889 Other specified postprocedural states: Secondary | ICD-10-CM | POA: Insufficient documentation

## 2016-10-16 DIAGNOSIS — Z8619 Personal history of other infectious and parasitic diseases: Secondary | ICD-10-CM | POA: Insufficient documentation

## 2016-10-16 DIAGNOSIS — Z823 Family history of stroke: Secondary | ICD-10-CM | POA: Diagnosis not present

## 2016-10-16 DIAGNOSIS — E282 Polycystic ovarian syndrome: Secondary | ICD-10-CM | POA: Diagnosis not present

## 2016-10-16 DIAGNOSIS — L900 Lichen sclerosus et atrophicus: Secondary | ICD-10-CM | POA: Insufficient documentation

## 2016-10-16 DIAGNOSIS — Z8249 Family history of ischemic heart disease and other diseases of the circulatory system: Secondary | ICD-10-CM | POA: Insufficient documentation

## 2016-10-16 DIAGNOSIS — I10 Essential (primary) hypertension: Secondary | ICD-10-CM | POA: Insufficient documentation

## 2016-10-16 DIAGNOSIS — Z91013 Allergy to seafood: Secondary | ICD-10-CM | POA: Insufficient documentation

## 2016-10-16 DIAGNOSIS — Z90722 Acquired absence of ovaries, bilateral: Secondary | ICD-10-CM | POA: Insufficient documentation

## 2016-10-16 DIAGNOSIS — Z8049 Family history of malignant neoplasm of other genital organs: Secondary | ICD-10-CM | POA: Diagnosis not present

## 2016-10-16 DIAGNOSIS — Z885 Allergy status to narcotic agent status: Secondary | ICD-10-CM | POA: Insufficient documentation

## 2016-10-16 DIAGNOSIS — Z86718 Personal history of other venous thrombosis and embolism: Secondary | ICD-10-CM | POA: Insufficient documentation

## 2016-10-16 DIAGNOSIS — D071 Carcinoma in situ of vulva: Secondary | ICD-10-CM | POA: Diagnosis not present

## 2016-10-16 DIAGNOSIS — Z833 Family history of diabetes mellitus: Secondary | ICD-10-CM | POA: Diagnosis not present

## 2016-10-16 NOTE — Progress Notes (Signed)
Follow-up Note: Gyn-Onc  Consult was initially requested by Dr. Leo Grosser for the evaluation of [name] 51 y.o. female  CC:  Chief Complaint  Patient presents with  . VIN lll  . Lichen Sclerosus   Assessment/Plan:  [name]  is a 50 y.o.  year old with a history of left posterior VIN 3, a right ovarian cyst and lichen simplex chronicus. s/p wide local excision of the left posterior vulva on 09/26/16  Wound separation - healing  Recommend follow-up on prn basis for wound healing.  Can return to work Friday, 10/20/16.  Recommend follow-up with Dr Leo Grosser for vulvar inspection at 6 monthly intervals x 1 year, then annually thereafter.  HPI:   [name] is a 51 year old woman who is seen in consultation at the request of Dr Leo Grosser for VIN 3, lichen simplex chronicus and a complex right ovarian cyst.  The patient has a long history of PCOS and has formed multiple cysts on her ovaries.  She has been recently followed for a right ovarian simple cyst which resolved and a 2.3cm complex hemorrhagic cyst which is also resolving.  At the time of the exam (on 08/03/16) she was noted to have a lesion on the left posterior vulva/buttocks. The patient had felt irritation in that area for approximately 9 months to 1 year. She is a nonsmoker with no recent hx of HPV related disease.  A biopsy was taken of the lesion and the perianal skin. The vulva/buttock skin on the left was resulted as VIN III and the left perianal lesion was lichen simplex chronicus.   Interval Hx: On 09/26/16 she underwent wide local excision of the left posterior vulva/buttock. Final pathology revealed VIN III, with negative margins.  Postop she developed wound separation.  Current Meds:  Outpatient Encounter Prescriptions as of 10/16/2016  Medication Sig  . acetaminophen-codeine (TYLENOL #3) 300-30 MG tablet Take 1-2 tablets by mouth every 4 (four) hours as needed for moderate pain.  Marland Kitchen albuterol (PROVENTIL  HFA;VENTOLIN HFA) 108 (90 BASE) MCG/ACT inhaler Inhale 2 puffs into the lungs every 6 (six) hours as needed for wheezing or shortness of breath.  Marland Kitchen CALCIUM-MAGNESIUM-ZINC PO Take 1 tablet by mouth daily.  . cholecalciferol (VITAMIN D) 1000 UNITS tablet Take 1,000 Units by mouth every morning.   . Cyanocobalamin (RA VITAMIN B-12 TR) 1000 MCG TBCR Take 1 tablet by mouth every morning.   . diazepam (VALIUM) 10 MG tablet Insert 1 tablet into vagina every 8 hours as needed for pelvic floor spasm  . potassium chloride (MICRO-K) 10 MEQ CR capsule Take 5 mEq by mouth every morning.   . Probiotic Product (ALIGN) 4 MG CAPS Take 1 capsule by mouth every morning.  . senna (SENOKOT) 8.6 MG TABS tablet Take 1 tablet (8.6 mg total) by mouth at bedtime.  . traMADol (ULTRAM) 50 MG tablet Take by mouth every 4 (four) hours as needed.  . vitamin E 1000 UNIT capsule Take 1,000 Units by mouth every morning.   Marland Kitchen losartan-hydrochlorothiazide (HYZAAR) 100-25 MG per tablet Take 1 tablet by mouth every morning.    No facility-administered encounter medications on file as of 10/16/2016.     Allergy:  Allergies  Allergen Reactions  . Doxycycline Nausea And Vomiting  . Hydrocodone Itching  . Oxycodone Itching    percocet  . Shellfish Allergy Rash    Social Hx:   Social History   Social History  . Marital status: Married    Spouse  name: N/A  . Number of children: N/A  . Years of education: N/A   Occupational History  . Not on file.   Social History Main Topics  . Smoking status: Never Smoker  . Smokeless tobacco: Never Used  . Alcohol use No  . Drug use: No  . Sexual activity: Yes    Partners: Male    Birth control/ protection: Surgical     Comment: HYST    Other Topics Concern  . Not on file   Social History Narrative  . No narrative on file    Past Surgical Hx:  Past Surgical History:  Procedure Laterality Date  . ABDOMINAL HYSTERECTOMY  09/14/2005   w/  Bilateral Salpingectomy  .  BREAST REDUCTION SURGERY Bilateral 07/29/2001  . CARPAL TUNNEL RELEASE Bilateral right 2009;   left 2010  . D & C HYSTEROSCOPY W/ RESECTION UTERINE FIBROIDS  02/02/2005  . DX LAPAROSCOPY W/ LYSIS ADHESIONS  1994;  1995;  1996  . LAPAROSCOPIC SALPINGO OOPHERECTOMY Left 01/12/2015   Procedure: LAPAROSCOPY;  Surgeon: Eldred Manges, MD;  Location: [facility];  Service: Gynecology;  Laterality: Left;  . LAPAROTOMY N/A 01/12/2015   Procedure: LAPAROTOMY;  Surgeon: Eldred Manges, MD;  Location: [facility];  Service: Gynecology;  Laterality: N/A;  . OOPHORECTOMY Left 01/12/2015   Procedure: OOPHORECTOMY;  Surgeon: Eldred Manges, MD;  Location: [facility];  Service: Gynecology;  Laterality: Left;  . REPAIR TENDONS RIGHT WRIST W/ REVISION CARPAL TUNNEL RELEASE  2015  . VULVECTOMY N/A 09/26/2016   Procedure: WIDE LOCAL EXCISION OF VULVA;  Surgeon: Everitt Amber, MD;  Location: [facility];  Service: Gynecology;  Laterality: N/A;  . WISDOM TOOTH EXTRACTION      Past Medical Hx:  Past Medical History:  Diagnosis Date  . Anxiety   . Chronic female pelvic pain   . Complication of anesthesia    HARD TO WAKE  . Cyst of right ovary   . High-tone pelvic floor dysfunction   . History of DVT (deep vein thrombosis)    left lower leg -- post op 02/ 2003 breast reduction surgery  . History of Helicobacter pylori infection   . History of ovarian cyst   . History of PID [place]  . History of uterine fibroid   . Hypertension   . Lichen simplex chronicus    via left perianal bx/ buttocks  . PCOS (polycystic ovarian syndrome)   . Pelvic adhesions    hx extensive s/p  lysis adhesions -- total x4  . Seasonal asthma   . VIN III (vulvar intraepithelial neoplasia III)     Past Gynecological History:  PCOS, pelvic floor dysfunction. No LMP recorded. Patient has had a hysterectomy.  Family Hx:  Family History  Problem Relation Age of Onset  . Cancer Paternal Grandmother 85    OVARIAN  .  Diabetes Maternal Aunt   . Cancer Paternal Aunt 58    CERVICAL  . Stroke Maternal Grandmother   . Hypertension Mother   . Hypertension Father     Review of Systems:  Constitutional  Feels well,    ENT Normal appearing ears and nares bilaterally Skin/Breast  No rash, sores, jaundice, itching, dryness Cardiovascular  No chest pain, shortness of breath, or edema  Pulmonary  No cough or wheeze.  Gastro Intestinal  No nausea, vomitting, or diarrhoea. No bright red blood per rectum, no abdominal pain, change in bowel movement, or constipation.  Genito Urinary  No frequency, urgency,  dysuria, pelvic pain with micturition - intermitten Musculo Skeletal  No myalgia, arthralgia, joint swelling or pain  Neurologic  No weakness, numbness, change in gait,  Psychology  No depression, anxiety, insomnia.   Vitals:  Blood pressure 115/77, pulse 79, temperature 99.1 F (37.3 C), temperature source Oral, resp. rate 20, height 5' 2.9" (1.598 m), weight 197 lb 6.4 oz (89.5 kg), SpO2 100 %.  Physical Exam: WD in NAD Neck  Supple NROM, without any enlargements.  Lymph Node Survey No cervical supraclavicular or inguinal adenopathy Cardiovascular  Pulse normal rate, regularity and rhythm. S1 and S2 normal.  Lungs  Clear to auscultation bilateraly, without wheezes/crackles/rhonchi. Good air movement.  Skin  No rash/lesions/breakdown  Psychiatry  Alert and oriented to person, place, and time  Abdomen  Normoactive bowel sounds, abdomen soft, non-tender and obese without evidence of hernia.  Back No CVA tenderness Genito Urinary  Vulva/vagina: wound separation. Base of wound is clean and granulating in - has had progressive healing since I last saw it. There is no evidence of cellulitis. The anterior 2cm of wound is in tact.   Rectal  deferred Extremities  No bilateral cyanosis, clubbing or edema.   Donaciano Eva, MD  10/16/2016, 12:11 PM

## 2016-10-16 NOTE — Patient Instructions (Signed)
Follow up with Dr. Leo Grosser this Monday as scheduled and then in 6 months.

## 2016-10-17 ENCOUNTER — Telehealth: Payer: Self-pay | Admitting: Gynecologic Oncology

## 2016-10-17 NOTE — Telephone Encounter (Signed)
Faxed completed FMLA/Disability forms on 10/09/16 to [employer] fax# [phone], [employer] fax#[phone], and [employer] fax [phone] and scanned copies of these forms in Epic

## 2016-11-30 IMAGING — CT CT RENAL STONE PROTOCOL
2 of 4 series · 17 of 46 positions shown, 19 images · non-contrast
Comparison: CT of the abdomen performed 11/24/2009, and abdominal
ultrasound performed 12/10/2009.

CLINICAL DATA: Acute onset of right-sided abdominal pain and
nausea. Initial encounter.

EXAM:
CT ABDOMEN AND PELVIS WITHOUT CONTRAST
TECHNIQUE: Multidetector CT imaging of the abdomen and pelvis was performed
following the standard protocol without IV contrast.

[Series 2: stone study 5.0 i30f 1 · axial · 0.81mm/px · z∈[-490,-55]mm · 14 of 95 slices shown, 16 images]
[im 4/95  soft-tissue]
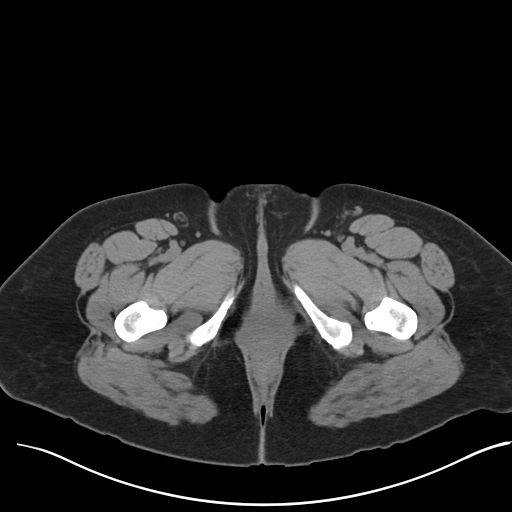
[im 4/95  bone]
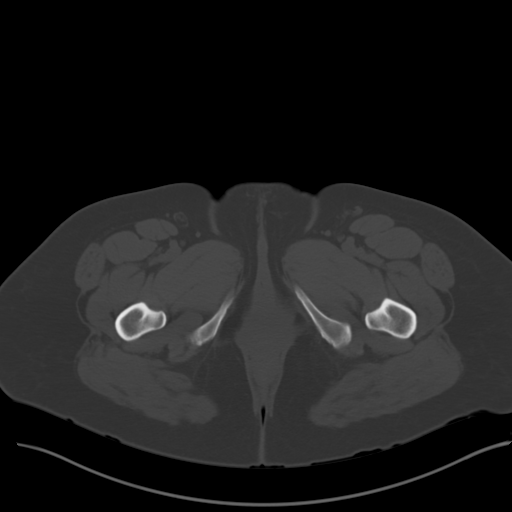
[im 12/95  soft-tissue]
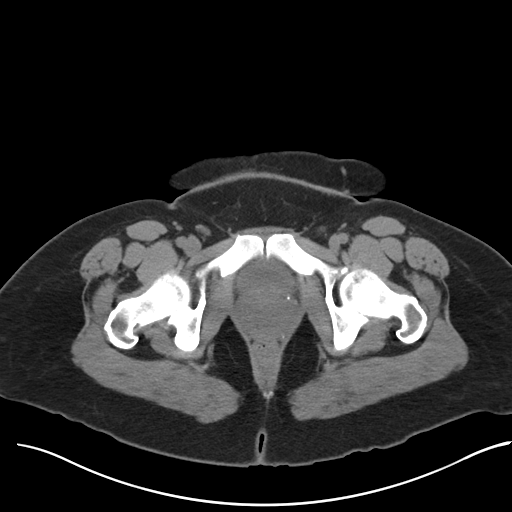
[im 19/95  soft-tissue]
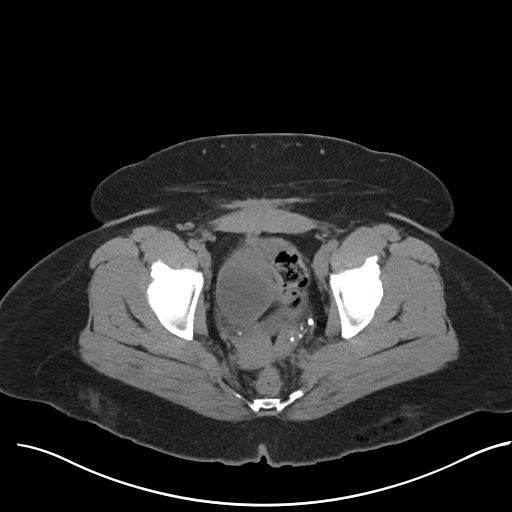
[im 27/95  soft-tissue]
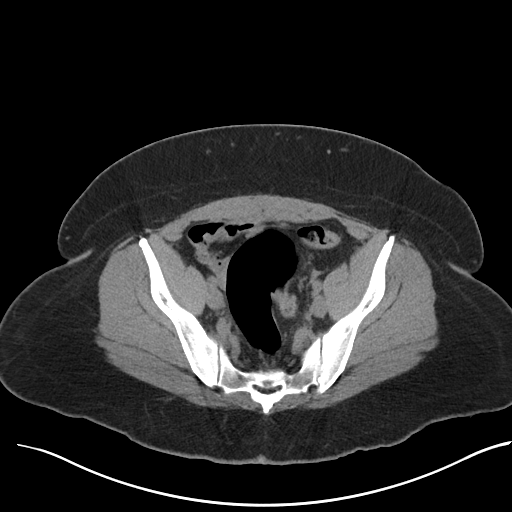
[im 31/95  soft-tissue]
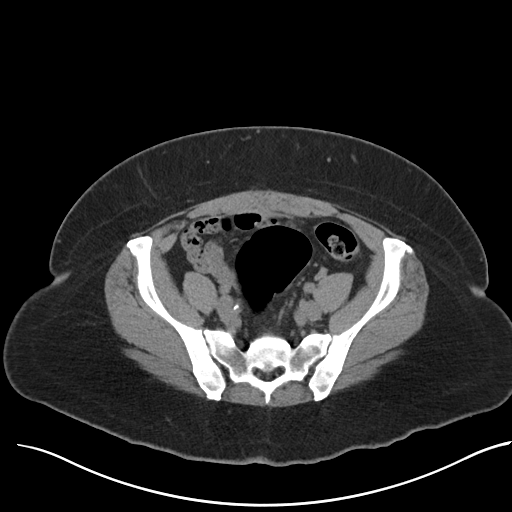
[im 38/95  soft-tissue]
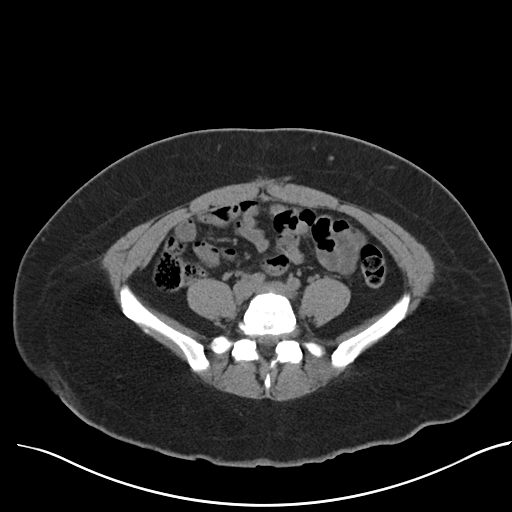
[im 46/95  soft-tissue]
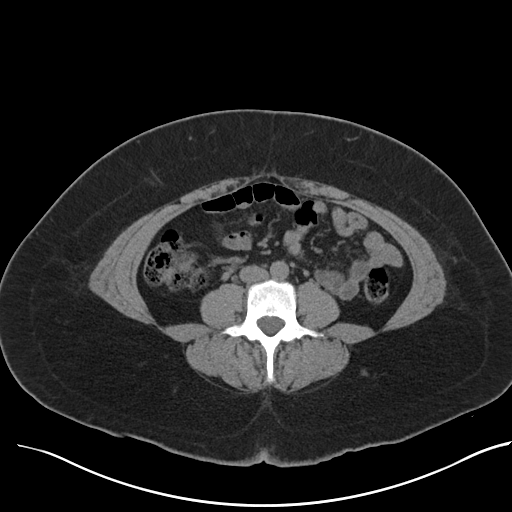
[im 49/95  soft-tissue]
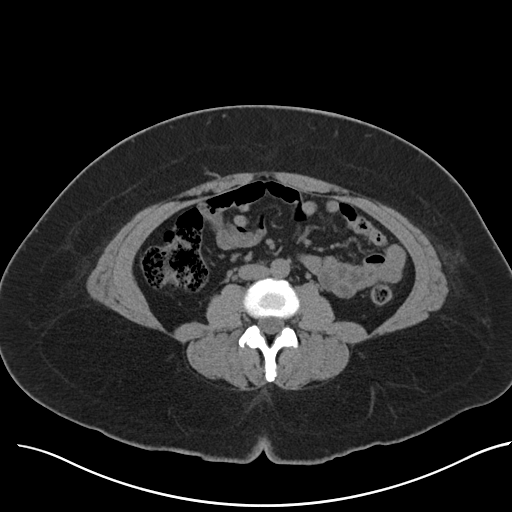
[im 57/95  soft-tissue]
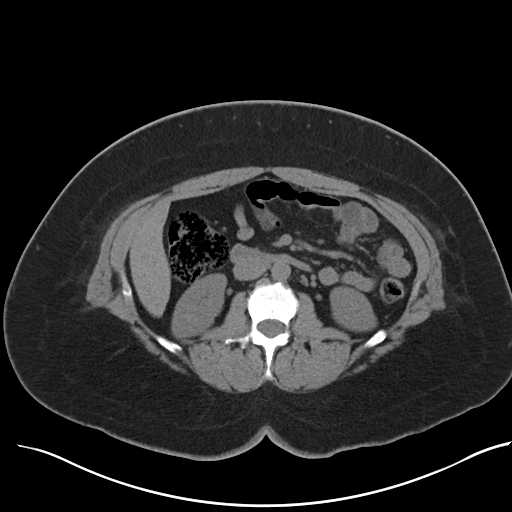
[im 57/95  bone]
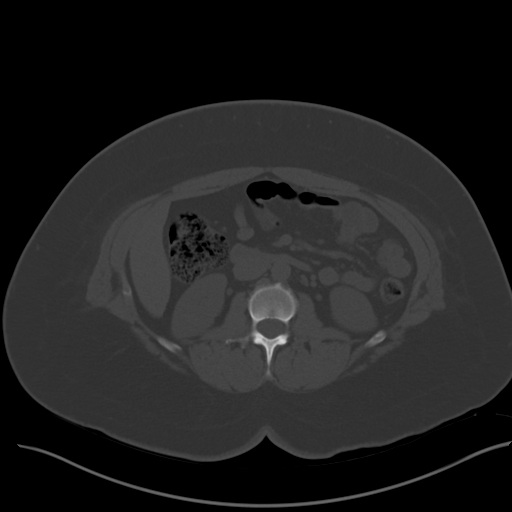
[im 64/95  soft-tissue]
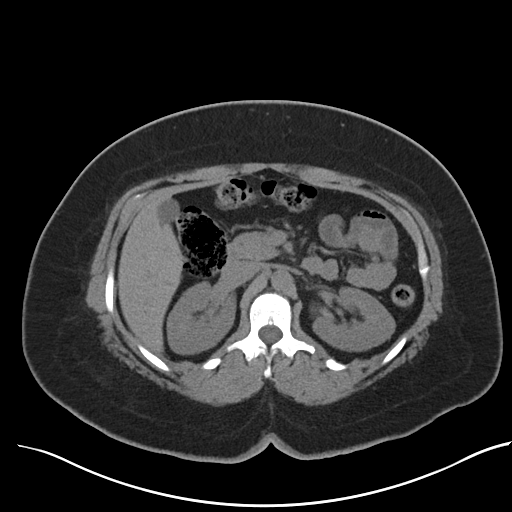
[im 72/95  soft-tissue]
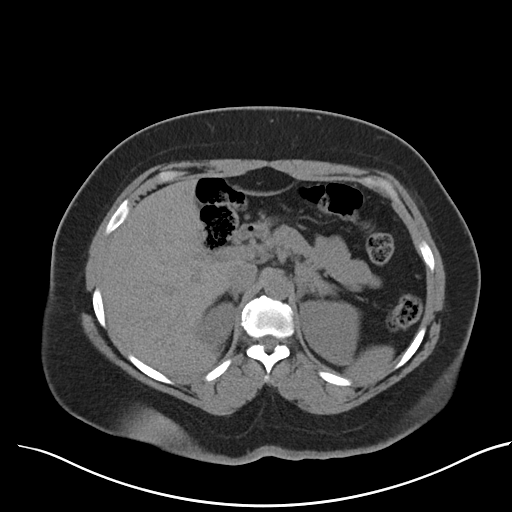
[im 76/95  soft-tissue]
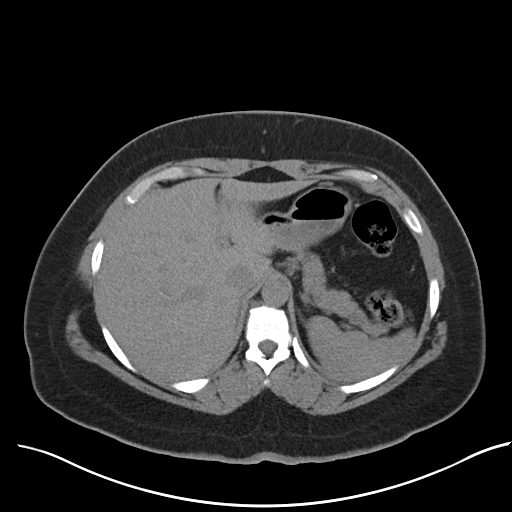
[im 83/95  soft-tissue]
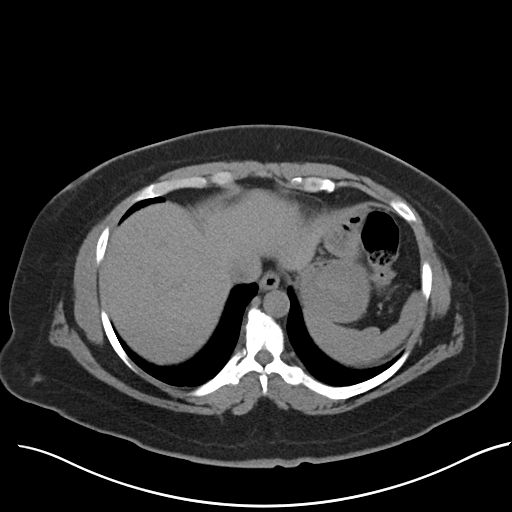
[im 91/95  soft-tissue]
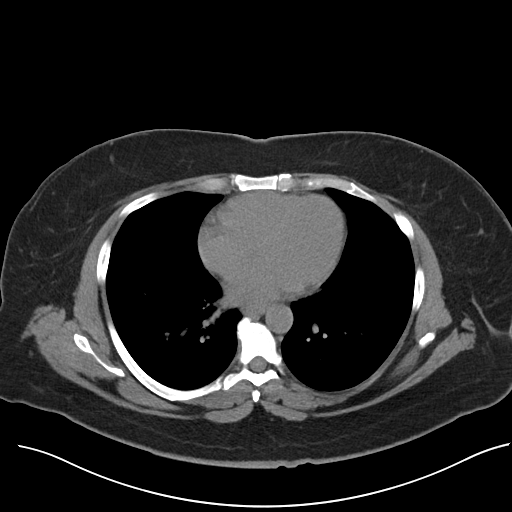

[Series 5: coronal soft tissue · coronal · 0.92mm/px · 3 of 77 slices shown]
[im 26/77  soft-tissue]
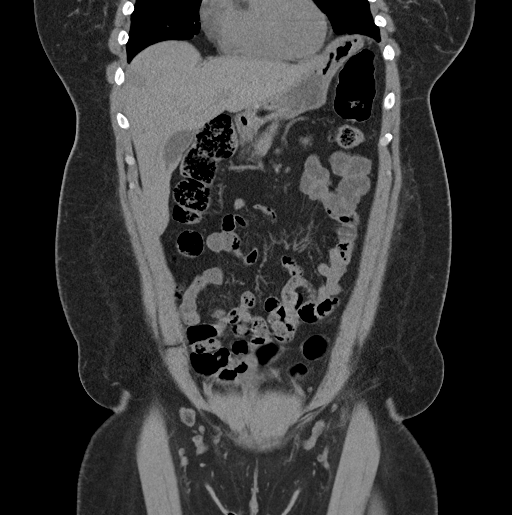
[im 34/77  soft-tissue]
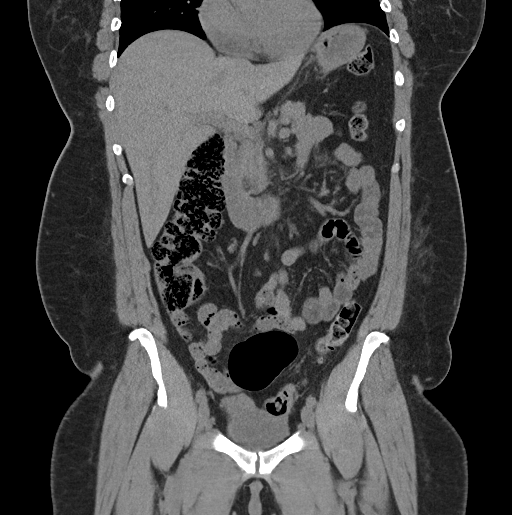
[im 43/77  soft-tissue]
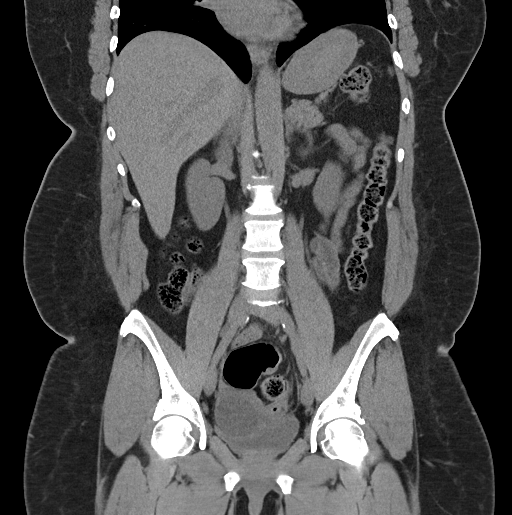

[17 of 46 positions shown; findings below may reference images not displayed]

FINDINGS: The visualized lung bases are clear.

The liver and spleen are unremarkable in appearance. The gallbladder
is within normal limits. The pancreas and adrenal glands are
unremarkable.

The kidneys are unremarkable in appearance. There is no evidence of
hydronephrosis. No renal or ureteral stones are seen. No perinephric
stranding is appreciated.

No free fluid is identified. The small bowel is unremarkable in
appearance. The stomach is within normal limits. No acute vascular
abnormalities are seen.

The appendix is normal in caliber and contains air, without evidence
of appendicitis. The colon is unremarkable in appearance.

The bladder is mildly distended and grossly unremarkable. A 4.7 cm
right adnexal cystic lesion is noted. The patient is status post
hysterectomy. No inguinal lymphadenopathy is seen.

No acute osseous abnormalities are identified. Mild vacuum
phenomenon is noted at L5-S1.
IMPRESSION: 1. 4.7 cm right adnexal cystic lesion noted. Pelvic ultrasound would
be helpful for further evaluation, when and as deemed clinically
appropriate.
2. Otherwise unremarkable noncontrast CT of the abdomen and pelvis.

## 2017-11-06 ENCOUNTER — Other Ambulatory Visit: Payer: Self-pay | Admitting: Obstetrics and Gynecology

## 2017-11-06 ENCOUNTER — Ambulatory Visit (HOSPITAL_COMMUNITY): Payer: BC Managed Care – PPO

## 2017-11-06 ENCOUNTER — Other Ambulatory Visit (HOSPITAL_COMMUNITY): Payer: Self-pay | Admitting: Obstetrics and Gynecology

## 2017-11-06 DIAGNOSIS — R102 Pelvic and perineal pain: Secondary | ICD-10-CM

## 2017-11-07 ENCOUNTER — Ambulatory Visit
Admission: RE | Admit: 2017-11-07 | Discharge: 2017-11-07 | Disposition: A | Discharge status: Home / Self Care | Payer: BC Managed Care – PPO | Source: Ambulatory Visit | Attending: Obstetrics and Gynecology | Admitting: Obstetrics and Gynecology

## 2017-11-07 DIAGNOSIS — R102 Pelvic and perineal pain: Secondary | ICD-10-CM

## 2018-06-29 ENCOUNTER — Other Ambulatory Visit: Payer: Self-pay

## 2018-06-29 ENCOUNTER — Emergency Department (HOSPITAL_COMMUNITY)
Admission: EM | Admit: 2018-06-29 | Discharge: 2018-06-29 | Disposition: A | Discharge status: Home / Self Care | Payer: BC Managed Care – PPO | Attending: Emergency Medicine | Admitting: Emergency Medicine

## 2018-06-29 ENCOUNTER — Encounter (HOSPITAL_COMMUNITY): Payer: Self-pay | Admitting: Emergency Medicine

## 2018-06-29 DIAGNOSIS — J45909 Unspecified asthma, uncomplicated: Secondary | ICD-10-CM | POA: Diagnosis not present

## 2018-06-29 DIAGNOSIS — H40051 Ocular hypertension, right eye: Secondary | ICD-10-CM | POA: Insufficient documentation

## 2018-06-29 DIAGNOSIS — H1131 Conjunctival hemorrhage, right eye: Secondary | ICD-10-CM

## 2018-06-29 DIAGNOSIS — Z79899 Other long term (current) drug therapy: Secondary | ICD-10-CM | POA: Insufficient documentation

## 2018-06-29 DIAGNOSIS — I10 Essential (primary) hypertension: Secondary | ICD-10-CM | POA: Diagnosis not present

## 2018-06-29 DIAGNOSIS — H5711 Ocular pain, right eye: Secondary | ICD-10-CM | POA: Diagnosis present

## 2018-06-29 MED ORDER — TETRACAINE HCL 0.5 % OP SOLN
2.0000 [drp] | Freq: Once | OPHTHALMIC | Status: AC
  Administered 2018-06-29: 2 [drp] via OPHTHALMIC
  Filled 2018-06-29: qty 4

## 2018-06-29 MED ORDER — TIMOLOL MALEATE 0.25 % OP SOLN
1.0000 [drp] | Freq: Once | OPHTHALMIC | Status: AC
  Administered 2018-06-29: 1 [drp] via OPHTHALMIC
  Filled 2018-06-29: qty 5

## 2018-06-29 MED ORDER — BRIMONIDINE TARTRATE 0.2 % OP SOLN
1.0000 [drp] | Freq: Once | OPHTHALMIC | Status: AC
  Administered 2018-06-29: 1 [drp] via OPHTHALMIC
  Filled 2018-06-29: qty 5

## 2018-06-29 MED ORDER — FLUORESCEIN SODIUM 1 MG OP STRP
1.0000 | ORAL_STRIP | Freq: Once | OPHTHALMIC | Status: AC
  Administered 2018-06-29: 1 via OPHTHALMIC
  Filled 2018-06-29: qty 1

## 2018-06-29 NOTE — Discharge Instructions (Addendum)
Call Dr. Katy Fitch Monday morning and tell them you need to be rechecked. Use the eye drops Timolol one drop in the right eye wait 5 minutes and put Brimonidine one drop in the right eye. You will only need to do this twice a day until you See Dr. Katy Fitch on Monday.

## 2018-06-29 NOTE — ED Triage Notes (Addendum)
Pt states she woke with R eye redness and burning / stinging pain. Cannot recall injury. Pt states light makes pain worse.

## 2018-06-29 NOTE — ED Provider Notes (Signed)
[facility] DEPT Provider Note   CSN: [id number] Arrival date & time: 06/29/18  1233     History   Chief Complaint Chief Complaint  Patient presents with  . Eye Pain    HPI [name] is a 53 y.o. female who presents to the ED with right eye redness and pain. Patient reports she work with the symptoms this morning. Patient does not remember injury to the eye. Patient reports bright light increases the pain.   HPI  Past Medical History:  Diagnosis Date  . Anxiety   . Chronic female pelvic pain   . Complication of anesthesia    HARD TO WAKE  . Cyst of right ovary   . High-tone pelvic floor dysfunction   . History of DVT (deep vein thrombosis)    left lower leg -- post op 02/ 2003 breast reduction surgery  . History of Helicobacter pylori infection   . History of ovarian cyst   . History of PID [place]  . History of uterine fibroid   . Hypertension   . Lichen simplex chronicus    via left perianal bx/ buttocks  . PCOS (polycystic ovarian syndrome)   . Pelvic adhesions    hx extensive s/p  lysis adhesions -- total x4  . Seasonal asthma   . VIN III (vulvar intraepithelial neoplasia III)     Patient Active Problem List   Diagnosis Date Noted  . 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 Asthma, chronic 01/14/2015  . Ovarian cyst 01/12/2015  . Pelvic pain in female 04/01/2012  . PID (pelvic inflammatory disease)   . ASCUS (atypical squamous cells of undetermined significance) on Pap smear   . Infertility associated with anovulation   . Fibroids   . HSV-2 infection   . DVT (deep venous thrombosis) ([place])   . Hypertension 10/12/2011  . Routine gynecological examination 10/12/2011  . Headache disorder 10/12/2011  . Frontal sinus pain 10/12/2011    Past Surgical History:  Procedure Laterality Date  . ABDOMINAL HYSTERECTOMY  09/14/2005   w/  Bilateral Salpingectomy  . BREAST REDUCTION SURGERY Bilateral  07/29/2001  . CARPAL TUNNEL RELEASE Bilateral right 2009;   left 2010  . D & C HYSTEROSCOPY W/ RESECTION UTERINE FIBROIDS  02/02/2005  . DX LAPAROSCOPY W/ LYSIS ADHESIONS  1994;  1995;  1996  . LAPAROSCOPIC SALPINGO OOPHERECTOMY Left 01/12/2015   Procedure: LAPAROSCOPY;  Surgeon: Eldred Manges, MD;  Location: [facility];  Service: Gynecology;  Laterality: Left;  . LAPAROTOMY N/A 01/12/2015   Procedure: LAPAROTOMY;  Surgeon: Eldred Manges, MD;  Location: [facility];  Service: Gynecology;  Laterality: N/A;  . OOPHORECTOMY Left 01/12/2015   Procedure: OOPHORECTOMY;  Surgeon: Eldred Manges, MD;  Location: [facility];  Service: Gynecology;  Laterality: Left;  . REPAIR TENDONS RIGHT WRIST W/ REVISION CARPAL TUNNEL RELEASE  2015  . VULVECTOMY N/A 09/26/2016   Procedure: WIDE LOCAL EXCISION OF VULVA;  Surgeon: Everitt Amber, MD;  Location: [facility];  Service: Gynecology;  Laterality: N/A;  . WISDOM TOOTH EXTRACTION       OB History    Gravida  0   Para  0   Term  0   Preterm  0   AB  0   Living  0     SAB  0   TAB  0   Ectopic  0   Multiple  0   Live Births  Home Medications    Prior to Admission medications   Medication Sig Start Date End Date Taking? Authorizing Provider  acetaminophen-codeine (TYLENOL #3) 300-30 MG tablet Take 1-2 tablets by mouth every 4 (four) hours as needed for moderate pain. 09/26/16   Everitt Amber, MD  albuterol (PROVENTIL HFA;VENTOLIN HFA) 108 (90 BASE) MCG/ACT inhaler Inhale 2 puffs into the lungs every 6 (six) hours as needed for wheezing or shortness of breath.    [provider]  CALCIUM-MAGNESIUM-ZINC PO Take 1 tablet by mouth daily.    [provider]  cholecalciferol (VITAMIN D) 1000 UNITS tablet Take 1,000 Units by mouth every morning.     [provider]  Cyanocobalamin (RA VITAMIN B-12 TR) 1000 MCG TBCR Take 1 tablet by mouth every morning.     [provider]  diazepam (VALIUM)  10 MG tablet Insert 1 tablet into vagina every 8 hours as needed for pelvic floor spasm 06/23/16   [provider]  losartan-hydrochlorothiazide (HYZAAR) 100-25 MG per tablet Take 1 tablet by mouth every morning.  10/28/14   [provider]  potassium chloride (MICRO-K) 10 MEQ CR capsule Take 5 mEq by mouth every morning.     [provider]  Probiotic Product (ALIGN) 4 MG CAPS Take 1 capsule by mouth every morning.    [provider]  senna (SENOKOT) 8.6 MG TABS tablet Take 1 tablet (8.6 mg total) by mouth at bedtime. 09/26/16   Everitt Amber, MD  traMADol Veatrice Bourbon) 50 MG tablet Take by mouth every 4 (four) hours as needed.    [provider]  vitamin E 1000 UNIT capsule Take 1,000 Units by mouth every morning.     [provider]    Family History Family History  Problem Relation Age of Onset  . Cancer Paternal Grandmother 80       OVARIAN  . Diabetes Maternal Aunt   . Cancer Paternal Aunt 39       CERVICAL  . Stroke Maternal Grandmother   . Hypertension Mother   . Hypertension Father     Social History Social History   Tobacco Use  . Smoking status: Never Smoker  . Smokeless tobacco: Never Used  Substance Use Topics  . Alcohol use: No  . Drug use: No     Allergies   Doxycycline; Hydrocodone; Oxycodone; and Shellfish allergy   Review of Systems Review of Systems  Eyes: Positive for photophobia, pain, redness and visual disturbance.  All other systems reviewed and are negative.    Physical Exam Updated Vital Signs BP 125/86 (BP Location: Left Arm)   Pulse 88   Temp 98.2 F (36.8 C) (Oral)   Resp 16   SpO2 99%   Physical Exam Vitals signs and nursing note reviewed.  Constitutional:      General: She is not in acute distress.    Appearance: She is well-developed.  HENT:     Head: Normocephalic.     Mouth/Throat:     Mouth: Mucous membranes are moist.  Eyes:     General: Lids are normal. Lids are everted, no  foreign bodies appreciated. Gaze aligned appropriately.        Right eye: No foreign body, discharge or hordeolum.     Extraocular Movements: Extraocular movements intact.     Conjunctiva/sclera:     Right eye: Hemorrhage present.     Pupils: Pupils are equal, round, and reactive to light.     Right eye: No corneal abrasion or fluorescein  uptake.     Slit lamp exam:    Right eye: No corneal ulcer, foreign body or hyphema.     Comments: Eye pressure: right 30   Left 18  Neck:     Musculoskeletal: Neck supple.  Cardiovascular:     Rate and Rhythm: Normal rate.  Pulmonary:     Effort: Pulmonary effort is normal.  Musculoskeletal: Normal range of motion.  Skin:    General: Skin is warm and dry.  Neurological:     Mental Status: She is alert and oriented to person, place, and time.  Psychiatric:        Mood and Affect: Mood normal.    5:10 pm Consult with Opthalmology on call for Dr. Katy Fitch who requested the patient to have Timolol eye drops one drop to right eye, wait 5 minutes and then Brimonidine one drop to right eye. Three rounds of this treatment then wait 20 minutes and recheck pressure 6:50 pm recheck pressure right eye 19, discussed results with on call doctor and he request patient use the drops one drop to right eye BID and see Dr. Katy Fitch on Monday. I also sent a picture of the patient's eye and he agrees that patient has a subconjunctival hemorrhage.   ED Treatments / Results  Labs (all labs ordered are listed, but only abnormal results are displayed) Labs Reviewed - No data to display  Radiology No results found.  Procedures Procedures (including critical care time)  Medications Ordered in ED Medications  tetracaine (PONTOCAINE) 0.5 % ophthalmic solution 2 drop (2 drops Right Eye Given 06/29/18 1715)  fluorescein ophthalmic strip 1 strip (1 strip Right Eye Given 06/29/18 1715)  timolol (TIMOPTIC) 0.25 % ophthalmic solution 1 drop (1 drop Right Eye Given 06/29/18 1757)    brimonidine (ALPHAGAN) 0.2 % ophthalmic solution 1 drop (1 drop Right Eye Given 06/29/18 1747)     Initial Impression / Assessment and Plan / ED Course  I have reviewed the triage vital signs and the nursing notes. 53 y.o. female here with right eye redness and pain stable for d/c after treatment in the ED and pressure improved. Patient to f/u with Dr. Katy Fitch on Monday. She will use the drops as directed. Patient reports that the pain did improve with treatment with the eye drops.   Final Clinical Impressions(s) / ED Diagnoses   Final diagnoses:  Subconjunctival hemorrhage of right eye  Increased pressure in the eye, right    ED Discharge Orders    None       Debroah Baller [place], [place] 06/29/18 1926    Lacretia Leigh, MD 06/29/18 2057

## 2019-06-17 ENCOUNTER — Ambulatory Visit: Payer: BC Managed Care – PPO | Attending: Internal Medicine

## 2019-06-17 DIAGNOSIS — Z20822 Contact with and (suspected) exposure to covid-19: Secondary | ICD-10-CM

## 2019-06-18 ENCOUNTER — Other Ambulatory Visit: Payer: BC Managed Care – PPO

## 2019-06-18 LAB — NOVEL CORONAVIRUS, NAA: SARS-CoV-2, NAA: NOT DETECTED

## 2019-09-18 ENCOUNTER — Other Ambulatory Visit: Payer: Self-pay | Admitting: Obstetrics and Gynecology

## 2019-09-18 DIAGNOSIS — R102 Pelvic and perineal pain: Secondary | ICD-10-CM

## 2019-09-25 ENCOUNTER — Ambulatory Visit
Admission: RE | Admit: 2019-09-25 | Discharge: 2019-09-25 | Disposition: A | Discharge status: Home / Self Care | Payer: BC Managed Care – PPO | Source: Ambulatory Visit | Attending: Obstetrics and Gynecology | Admitting: Obstetrics and Gynecology

## 2019-09-25 DIAGNOSIS — R102 Pelvic and perineal pain: Secondary | ICD-10-CM

## 2019-10-24 ENCOUNTER — Telehealth: Payer: Self-pay | Admitting: *Deleted

## 2019-10-24 ENCOUNTER — Ambulatory Visit: Payer: BC Managed Care – PPO | Admitting: Gynecologic Oncology

## 2019-10-24 NOTE — Telephone Encounter (Signed)
Called and scheduled the patient for a new appt on 5/4

## 2019-10-25 NOTE — Progress Notes (Signed)
GYNECOLOGIC ONCOLOGY NEW PATIENT CONSULTATION   Patient Name: [name]  Patient Age: 54 y.o. Date of Service: 10/28/19 Referring Provider: Jilda Panda, MD [id number] [place],  [place] [address]   Primary Care Provider: Jilda Panda, MD Consulting Provider: Jeral Pinch, MD   Assessment/Plan:  Peri-menopausal patient with history of PCOS with mostly simple appearing right adnexal mass that has been stable in size since 2019.   Discussed ultrasound findings, overall benign appearance of the right ovary similar to 2019. Given her history of PCOS, similar size and appearance of the ovary, I suspect that this is a benign process. We reviewed her complex surgical history notable for significant adhesions at the time of her last abdominal surgery in 2016.  We discussed treatment options in terms of surgical management and conservative. I am concerned about her surgical issues and adhesive disease; the patient shares these concerns and prefers to avoid surgery, despite her symptoms, if possible. I think that given her symptomatology that it would be reasonable to attempt ovarian suppression and see if she has an change in cystic lesions or improvement of her symptoms. We discussed management options for ovarian suppression. With her history of provoked DVT after surgery and on oral contraception (as well as her age and weight), I don't think that she is a good candidate for estrogen therapy. Thus, we discussed the use of higher dose estrogen (specifically Depo-Provera or Depo-Lupron). While there is some small increase in VTE with the use of higher dose progesterone, this risk is overall much lower than with the use of estrogen. We discussed this risk as well as other side effects of higher dose progesterone. The patient was amenable to a trial of ovarian suppression with plan for repeat ultrasound in 3 months.   We also discussed pelvic floor symptoms and referral to pelvic floor physical  therapy.  A copy of this note was sent to the patient's referring provider.   50 minutes of total time was spent for this patient encounter, including preparation, face-to-face counseling with the patient and coordination of care, and documentation of the encounter.   Jeral Pinch, MD  Division of Gynecologic Oncology  Department of Obstetrics and Gynecology  [facility]  ___________________________________________  Chief Complaint: Chief Complaint  Patient presents with  . Adnexal mass    New Patient    History of Present Illness:  [name] is a 54 y.o. y.o. female who is seen in consultation at the request of Dr. Charlesetta Garibaldi for an evaluation of a persistent complex adnexal mass.  The patient endorses 3-4 months of worsening pelvic pain that she describes as constant, sharp. She feels increased pain and pressure when her bladder is full and often holds her right side due to pain while she is urinating. She also endorses increased frequency. She uses tylenol as needed with some relief. When the pain is worse, she take tramadol which helps.   She reports normal bowel movements. She denies any vaginal bleeding or discharge. Her appetite has been up and down (decreased related to pain). She has occasional nausea, denies emesis. Endorses some abdominal bloating. Denies any recent change in weight. Has had dyspareunia for years.   She is currently using vaginal suppositories on average less than once a week for vaginal dryness.  The symptom comes and goes.  Her history is notable for hysterectomy (lsc) at age 60 for fibroid uterus. Her left ovary was removed in 2016 secondary to a persistent mass. The  surgery took 5.5 hours and was complicated by significant adhesive disease and disruption of the sigmoid serosa requiring intra-op general surgery consultation and repair.  Her history is also notable for VIN3 treated with WLE in 2018. She had a DVT after  surgery almost 20 years ago treated with approximately 6 months of Coumadin.  PAST MEDICAL HISTORY:  Past Medical History:  Diagnosis Date  . Anxiety   . Chronic female pelvic pain   . Complication of anesthesia    HARD TO WAKE  . Cyst of right ovary   . High-tone pelvic floor dysfunction   . History of DVT (deep vein thrombosis)    left lower leg -- post op 02/ 2003 breast reduction surgery  . History of Helicobacter pylori infection   . History of ovarian cyst   . History of PID [place]  . History of uterine fibroid   . Hypertension   . Lichen simplex chronicus    via left perianal bx/ buttocks  . PCOS (polycystic ovarian syndrome)   . Pelvic adhesions    hx extensive s/p  lysis adhesions -- total x4  . Seasonal asthma   . VIN III (vulvar intraepithelial neoplasia III)      PAST SURGICAL HISTORY:  Past Surgical History:  Procedure Laterality Date  . ABDOMINAL HYSTERECTOMY  09/14/2005   w/  Bilateral Salpingectomy  . BREAST REDUCTION SURGERY Bilateral 07/29/2001  . CARPAL TUNNEL RELEASE Bilateral right 2009;   left 2010  . D & C HYSTEROSCOPY W/ RESECTION UTERINE FIBROIDS  02/02/2005  . DX LAPAROSCOPY W/ LYSIS ADHESIONS  1994;  1995;  1996  . LAPAROSCOPIC SALPINGO OOPHERECTOMY Left 01/12/2015   Procedure: LAPAROSCOPY;  Surgeon: Eldred Manges, MD;  Location: [facility];  Service: Gynecology;  Laterality: Left;  . LAPAROTOMY N/A 01/12/2015   Procedure: LAPAROTOMY;  Surgeon: Eldred Manges, MD;  Location: [facility];  Service: Gynecology;  Laterality: N/A;  . OOPHORECTOMY Left 01/12/2015   Procedure: OOPHORECTOMY;  Surgeon: Eldred Manges, MD;  Location: [facility];  Service: Gynecology;  Laterality: Left;  . REPAIR TENDONS RIGHT WRIST W/ REVISION CARPAL TUNNEL RELEASE  2015  . VULVECTOMY N/A 09/26/2016   Procedure: WIDE LOCAL EXCISION OF VULVA;  Surgeon: Everitt Amber, MD;  Location: [facility];  Service: Gynecology;  Laterality: N/A;  . WISDOM TOOTH EXTRACTION       OB/GYN HISTORY:  OB History  Gravida Para Term Preterm AB Living  0 0 0 0 0 0  SAB TAB Ectopic Multiple Live Births  0 0 0 0      No LMP recorded. Patient has had a hysterectomy.  Age at menarche: 61  Age at menopause: n/a Hx of HRT: no Hx of STDs: HPV, HSV2 Last pap: 2020, negative History of abnormal pap smears: yes  SCREENING STUDIES:  Last mammogram: 10/2018  Last colonoscopy: 10/2018  MEDICATIONS: Outpatient Encounter Medications as of 10/28/2019  Medication Sig  . albuterol (PROVENTIL HFA;VENTOLIN HFA) 108 (90 BASE) MCG/ACT inhaler Inhale 2 puffs into the lungs every 6 (six) hours as needed for wheezing or shortness of breath.  . Cyanocobalamin (VITAMIN B 12 PO) Take 1,000 m by mouth.  . potassium chloride (MICRO-K) 10 MEQ CR capsule Take 5 mEq by mouth every morning.   Marland Kitchen telmisartan-hydrochlorothiazide (MICARDIS HCT) 80-25 MG tablet Take 1 tablet by mouth daily.  . vitamin E 1000 UNIT capsule Take 1,000 Units by mouth every morning.   . [DISCONTINUED] acetaminophen-codeine (TYLENOL #3) 300-30 MG  tablet Take 1-2 tablets by mouth every 4 (four) hours as needed for moderate pain.  . [DISCONTINUED] CALCIUM-MAGNESIUM-ZINC PO Take 1 tablet by mouth daily.  . [DISCONTINUED] cholecalciferol (VITAMIN D) 1000 UNITS tablet Take 1,000 Units by mouth every morning.   . [DISCONTINUED] Cyanocobalamin (RA VITAMIN B-12 TR) 1000 MCG TBCR Take 1 tablet by mouth every morning.   . [DISCONTINUED] diazepam (VALIUM) 10 MG tablet Insert 1 tablet into vagina every 8 hours as needed for pelvic floor spasm  . [DISCONTINUED] losartan-hydrochlorothiazide (HYZAAR) 100-25 MG per tablet Take 1 tablet by mouth every morning.   . [DISCONTINUED] Probiotic Product (ALIGN) 4 MG CAPS Take 1 capsule by mouth every morning.  . [DISCONTINUED] senna (SENOKOT) 8.6 MG TABS tablet Take 1 tablet (8.6 mg total) by mouth at bedtime.  . [DISCONTINUED] traMADol (ULTRAM) 50 MG tablet Take by mouth every 4 (four) hours as  needed.  . [DISCONTINUED] vitamin E 1000 UNIT capsule Take by mouth.   No facility-administered encounter medications on file as of 10/28/2019.    ALLERGIES:  Allergies  Allergen Reactions  . Doxycycline Nausea And Vomiting  . Hydrocodone Itching  . Oxycodone Itching    percocet  . Shellfish Allergy Rash     FAMILY HISTORY:  Family History  Problem Relation Age of Onset  . Cancer Paternal Grandmother 61       OVARIAN  . Diabetes Maternal Aunt   . Cancer Paternal Aunt 62       CERVICAL  . Stroke Maternal Grandmother   . Hypertension Mother   . Cancer Mother        breast  . Hypertension Father   . Heart attack Father      SOCIAL HISTORY:    Social Connections:   . Frequency of Communication with Friends and Family:   . Frequency of Social Gatherings with Friends and Family:   . Attends Religious Services:   . Active Member of Clubs or Organizations:   . Attends Archivist Meetings:   Marland Kitchen Marital Status:     REVIEW OF SYSTEMS:  Pertinent positives as per HPI Denies fevers, chills, fatigue, unexplained weight changes. Denies hearing loss, neck lumps or masses, mouth sores, ringing in ears or voice changes. Denies cough or wheezing.  Denies shortness of breath. Denies chest pain or palpitations. Denies leg swelling. Denies blood in stools, constipation, diarrhea, vomiting, or early satiety. Denies dysuria, hematuria or incontinence. Denies hot flashes, vaginal bleeding or vaginal discharge.   Denies joint pain, back pain or muscle pain/cramps. Denies itching, rash, or wounds. Denies dizziness, headaches, numbness or seizures. Denies swollen lymph nodes or glands, denies easy bruising or bleeding. Denies anxiety, depression, confusion, or decreased concentration.  Physical Exam:  Vital Signs for this encounter:  Blood pressure 127/75, pulse 79, temperature 98.5 F (36.9 C), temperature source Temporal, resp. rate 16, height 5\' 3"  (1.6 m), weight 209 lb  (94.8 kg), SpO2 100 %. Body mass index is 37.02 kg/m. General: Alert, oriented, no acute distress.  HEENT: Normocephalic, atraumatic. Sclera anicteric.  Chest: Clear to auscultation bilaterally. No wheezes, rhonchi, or rales. Cardiovascular: Regular rate and rhythm, no murmurs, rubs, or gallops.  Abdomen: Obese. Normoactive bowel sounds. Soft, nondistended, nontender to palpation. No masses or hepatosplenomegaly appreciated. No palpable fluid wave.  Extremities: Grossly normal range of motion. Warm, well perfused. No edema bilaterally.  Skin: No rashes or lesions.  Lymphatics: No cervical, supraclavicular, or inguinal adenopathy.  GU:  Normal external female genitalia.  No lesions. No  discharge or bleeding.             Bladder/urethra:  No lesions or masses, well supported bladder             Vagina: well rugated, no lesions or masses.             Cervix: Surgically absent.             Uterus: Surgically absent.             Adnexa: Smooth mass appreciated high within the less in the midline and to the right, difficult to approximate size given body habitus.  Rectal: confirms no nodularity  LABORATORY AND RADIOLOGIC DATA:  Outside medical records were reviewed to synthesize the above history, along with the history and physical obtained during the visit.   Lab Results  Component Value Date   WBC 6.2 04/20/2015   HGB 13.6 09/26/2016   HCT 40.0 09/26/2016   PLT 302 04/20/2015   GLUCOSE 90 09/26/2016   ALT 14 04/20/2015   AST 13 (L) 04/20/2015   NA 143 09/26/2016   K 3.8 09/26/2016   CL 102 04/20/2015   CREATININE 0.94 04/20/2015   BUN 9 04/20/2015   CO2 27 04/20/2015   Pelvic ultrasound 09/25/19: No free pelvic fluid. Mass identified within the RIGHT adnexa, 6.0 x 6.0 x 5.6 cm, volume 105 mL, which may represent RIGHT ovary containing a complicated cystic lesion measuring 4.5 x 5.7 x 4.6 cm. This lesion contains 2 simple appearing locule separated by a 2 mm thick septation of  uniform thickness. Asymmetric tissues to the superior aspect of the lesion could represent residual ovary. No definite mural nodules within the cystic locules.  Pelvic ultrasound 10/2017: Right ovary Measurements: 6.6 x 2.3 x 5.5 cm. A complex cystic lesion is seen measuring 2.6 x 1.5 by 3.0 cm. This has solid and cystic areas although no definite blood flow seen within this lesion on color Doppler ultrasound. This has indeterminate characteristics, but likely represents a corpus luteum.

## 2019-10-28 ENCOUNTER — Inpatient Hospital Stay: Payer: BC Managed Care – PPO | Attending: Gynecologic Oncology | Admitting: Gynecologic Oncology

## 2019-10-28 ENCOUNTER — Other Ambulatory Visit: Payer: Self-pay | Admitting: Gynecologic Oncology

## 2019-10-28 ENCOUNTER — Encounter: Payer: Self-pay | Admitting: Gynecologic Oncology

## 2019-10-28 ENCOUNTER — Other Ambulatory Visit: Payer: Self-pay

## 2019-10-28 VITALS — BP 127/75 | HR 79 | Temp 98.5°F | Resp 16 | Ht 63.0 in | Wt 209.0 lb

## 2019-10-28 DIAGNOSIS — R102 Pelvic and perineal pain unspecified side: Secondary | ICD-10-CM

## 2019-10-28 DIAGNOSIS — M6289 Other specified disorders of muscle: Secondary | ICD-10-CM | POA: Diagnosis not present

## 2019-10-28 DIAGNOSIS — N9489 Other specified conditions associated with female genital organs and menstrual cycle: Secondary | ICD-10-CM

## 2019-10-28 DIAGNOSIS — Z90710 Acquired absence of both cervix and uterus: Secondary | ICD-10-CM | POA: Diagnosis not present

## 2019-10-28 DIAGNOSIS — Z79899 Other long term (current) drug therapy: Secondary | ICD-10-CM | POA: Insufficient documentation

## 2019-10-28 DIAGNOSIS — D398 Neoplasm of uncertain behavior of other specified female genital organs: Secondary | ICD-10-CM

## 2019-10-28 DIAGNOSIS — Z86718 Personal history of other venous thrombosis and embolism: Secondary | ICD-10-CM | POA: Insufficient documentation

## 2019-10-28 DIAGNOSIS — Z90721 Acquired absence of ovaries, unilateral: Secondary | ICD-10-CM | POA: Insufficient documentation

## 2019-10-28 DIAGNOSIS — F419 Anxiety disorder, unspecified: Secondary | ICD-10-CM | POA: Insufficient documentation

## 2019-10-28 DIAGNOSIS — L280 Lichen simplex chronicus: Secondary | ICD-10-CM | POA: Diagnosis not present

## 2019-10-28 DIAGNOSIS — I10 Essential (primary) hypertension: Secondary | ICD-10-CM | POA: Diagnosis not present

## 2019-10-28 DIAGNOSIS — E282 Polycystic ovarian syndrome: Secondary | ICD-10-CM | POA: Diagnosis not present

## 2019-10-28 NOTE — Patient Instructions (Addendum)
It was a pleasure meeting you today!  I will put in a referral for pelvic floor physical therapy and we will plan to give you the progesterone injection for ovarian suppression next week.  I will see you 3 months after the injection and we will repeat an ultrasound at that time.  If you have any questions or concerns or develop new symptoms prior to that, please call me at [phone].  You will receive a phone call from the physical therapy office to arrange for your pelvic floor physical therapy.  Sharyn Lull our scheduler will also give you a call when our new schedule comes out to arrange for your ultrasound and office visit in three months.  Medroxyprogesterone injection  What is this medicine? MEDROXYPROGESTERONE (me DROX ee proe JES te rone) contraceptive injections prevent pregnancy and suppress ovarian function. They provide effective birth control for 3 months. Depo-subQ Provera 104 is also used for treating pain related to endometriosis. This medicine may be used for other purposes; ask your health care provider or pharmacist if you have questions. COMMON BRAND NAME(S): Depo-Provera, Depo-subQ Provera 104 What should I tell my health care provider before I take this medicine? They need to know if you have any of these conditions:  frequently drink alcohol  asthma  blood vessel disease or a history of a blood clot in the lungs or legs  bone disease such as osteoporosis  breast cancer  diabetes  eating disorder (anorexia nervosa or bulimia)  high blood pressure  HIV infection or AIDS  kidney disease  liver disease  mental depression  migraine  seizures (convulsions)  stroke  tobacco smoker  vaginal bleeding  an unusual or allergic reaction to medroxyprogesterone, other hormones, medicines, foods, dyes, or preservatives  pregnant or trying to get pregnant  breast-feeding How should I use this medicine? Depo-Provera Contraceptive injection is given into a  muscle. Depo-subQ Provera 104 injection is given under the skin. These injections are given by a health care professional. You must not be pregnant before getting an injection.  Talk to your pediatrician regarding the use of this medicine in children. Special care may be needed. These injections have been used in female children who have started having menstrual periods. Overdosage: If you think you have taken too much of this medicine contact a poison control center or emergency room at once. NOTE: This medicine is only for you. Do not share this medicine with others. What if I miss a dose? Try not to miss a dose. You must get an injection once every 3 months to maintain birth control. If you cannot keep an appointment, call and reschedule it. If you wait longer than 13 weeks between Depo-Provera contraceptive injections or longer than 14 weeks between Depo-subQ Provera 104 injections, you could get pregnant. Use another method for birth control if you miss your appointment. You may also need a pregnancy test before receiving another injection. What may interact with this medicine? Do not take this medicine with any of the following medications:  bosentan This medicine may also interact with the following medications:  aminoglutethimide  antibiotics or medicines for infections, especially rifampin, rifabutin, rifapentine, and griseofulvin  aprepitant  barbiturate medicines such as phenobarbital or primidone  bexarotene  carbamazepine  medicines for seizures like ethotoin, felbamate, oxcarbazepine, phenytoin, topiramate  modafinil  St. John's wort This list may not describe all possible interactions. Give your health care provider a list of all the medicines, herbs, non-prescription drugs, or dietary supplements you use.  Also tell them if you smoke, drink alcohol, or use illegal drugs. Some items may interact with your medicine. What should I watch for while using this medicine? This  drug does not protect you against HIV infection (AIDS) or other sexually transmitted diseases. Use of this product may cause you to lose calcium from your bones. Loss of calcium may cause weak bones (osteoporosis). Only use this product for more than 2 years if other forms of birth control are not right for you. The longer you use this product for birth control the more likely you will be at risk for weak bones. Ask your health care professional how you can keep strong bones. You may have a change in bleeding pattern or irregular periods. Many females stop having periods while taking this drug. If you have received your injections on time, your chance of being pregnant is very low. If you think you may be pregnant, see your health care professional as soon as possible. Tell your health care professional if you want to get pregnant within the next year. The effect of this medicine may last a long time after you get your last injection. What side effects may I notice from receiving this medicine? Side effects that you should report to your doctor or health care professional as soon as possible:  allergic reactions like skin rash, itching or hives, swelling of the face, lips, or tongue  breast tenderness or discharge  breathing problems  changes in vision  depression  feeling faint or lightheaded, falls  fever  pain in the abdomen, chest, groin, or leg  problems with balance, talking, walking  unusually weak or tired  yellowing of the eyes or skin Side effects that usually do not require medical attention (report to your doctor or health care professional if they continue or are bothersome):  acne  fluid retention and swelling  headache  irregular periods, spotting, or absent periods  temporary pain, itching, or skin reaction at site where injected  weight gain This list may not describe all possible side effects. Call your doctor for medical advice about side effects. You may  report side effects to FDA at 1-800-FDA-1088. Where should I keep my medicine? This does not apply. The injection will be given to you by a health care professional. NOTE: This sheet is a summary. It may not cover all possible information. If you have questions about this medicine, talk to your doctor, pharmacist, or health care provider.  2020 Elsevier/Gold Standard (2008-07-03 18:37:56)

## 2019-10-28 NOTE — Progress Notes (Signed)
Depo provera injection per Dr. Berline Lopes from office visit on 10/28/2019

## 2019-11-03 ENCOUNTER — Inpatient Hospital Stay: Payer: BC Managed Care – PPO

## 2019-11-07 ENCOUNTER — Other Ambulatory Visit: Payer: Self-pay

## 2019-11-07 ENCOUNTER — Inpatient Hospital Stay: Payer: BC Managed Care – PPO

## 2019-11-07 VITALS — BP 154/90 | HR 73 | Temp 98.1°F | Resp 16

## 2019-11-07 DIAGNOSIS — D398 Neoplasm of uncertain behavior of other specified female genital organs: Secondary | ICD-10-CM | POA: Diagnosis not present

## 2019-11-07 DIAGNOSIS — N9489 Other specified conditions associated with female genital organs and menstrual cycle: Secondary | ICD-10-CM

## 2019-11-07 MED ORDER — MEDROXYPROGESTERONE ACETATE 150 MG/ML IM SUSP
150.0000 mg | Freq: Once | INTRAMUSCULAR | Status: AC
  Administered 2019-11-07: 150 mg via INTRAMUSCULAR
  Filled 2019-11-07: qty 1

## 2019-11-07 NOTE — Patient Instructions (Signed)
Medroxyprogesterone injection [Contraceptive] What is this medicine? MEDROXYPROGESTERONE (me DROX ee proe JES te rone) contraceptive injections prevent pregnancy. They provide effective birth control for 3 months. Depo-subQ Provera 104 is also used for treating pain related to endometriosis. This medicine may be used for other purposes; ask your health care provider or pharmacist if you have questions. COMMON BRAND NAME(S): Depo-Provera, Depo-subQ Provera 104 What should I tell my health care provider before I take this medicine? They need to know if you have any of these conditions:  frequently drink alcohol  asthma  blood vessel disease or a history of a blood clot in the lungs or legs  bone disease such as osteoporosis  breast cancer  diabetes  eating disorder (anorexia nervosa or bulimia)  high blood pressure  HIV infection or AIDS  kidney disease  liver disease  mental depression  migraine  seizures (convulsions)  stroke  tobacco smoker  vaginal bleeding  an unusual or allergic reaction to medroxyprogesterone, other hormones, medicines, foods, dyes, or preservatives  pregnant or trying to get pregnant  breast-feeding How should I use this medicine? Depo-Provera Contraceptive injection is given into a muscle. Depo-subQ Provera 104 injection is given under the skin. These injections are given by a health care professional. You must not be pregnant before getting an injection. The injection is usually given during the first 5 days after the start of a menstrual period or 6 weeks after delivery of a baby. Talk to your pediatrician regarding the use of this medicine in children. Special care may be needed. These injections have been used in female children who have started having menstrual periods. Overdosage: If you think you have taken too much of this medicine contact a poison control center or emergency room at once. NOTE: This medicine is only for you. Do not  share this medicine with others. What if I miss a dose? Try not to miss a dose. You must get an injection once every 3 months to maintain birth control. If you cannot keep an appointment, call and reschedule it. If you wait longer than 13 weeks between Depo-Provera contraceptive injections or longer than 14 weeks between Depo-subQ Provera 104 injections, you could get pregnant. Use another method for birth control if you miss your appointment. You may also need a pregnancy test before receiving another injection. What may interact with this medicine? Do not take this medicine with any of the following medications:  bosentan This medicine may also interact with the following medications:  aminoglutethimide  antibiotics or medicines for infections, especially rifampin, rifabutin, rifapentine, and griseofulvin  aprepitant  barbiturate medicines such as phenobarbital or primidone  bexarotene  carbamazepine  medicines for seizures like ethotoin, felbamate, oxcarbazepine, phenytoin, topiramate  modafinil  St. John's wort This list may not describe all possible interactions. Give your health care provider a list of all the medicines, herbs, non-prescription drugs, or dietary supplements you use. Also tell them if you smoke, drink alcohol, or use illegal drugs. Some items may interact with your medicine. What should I watch for while using this medicine? This drug does not protect you against HIV infection (AIDS) or other sexually transmitted diseases. Use of this product may cause you to lose calcium from your bones. Loss of calcium may cause weak bones (osteoporosis). Only use this product for more than 2 years if other forms of birth control are not right for you. The longer you use this product for birth control the more likely you will be at risk   for weak bones. Ask your health care professional how you can keep strong bones. You may have a change in bleeding pattern or irregular periods.  Many females stop having periods while taking this drug. If you have received your injections on time, your chance of being pregnant is very low. If you think you may be pregnant, see your health care professional as soon as possible. Tell your health care professional if you want to get pregnant within the next year. The effect of this medicine may last a long time after you get your last injection. What side effects may I notice from receiving this medicine? Side effects that you should report to your doctor or health care professional as soon as possible:  allergic reactions like skin rash, itching or hives, swelling of the face, lips, or tongue  breast tenderness or discharge  breathing problems  changes in vision  depression  feeling faint or lightheaded, falls  fever  pain in the abdomen, chest, groin, or leg  problems with balance, talking, walking  unusually weak or tired  yellowing of the eyes or skin Side effects that usually do not require medical attention (report to your doctor or health care professional if they continue or are bothersome):  acne  fluid retention and swelling  headache  irregular periods, spotting, or absent periods  temporary pain, itching, or skin reaction at site where injected  weight gain This list may not describe all possible side effects. Call your doctor for medical advice about side effects. You may report side effects to FDA at 1-800-FDA-1088. Where should I keep my medicine? This does not apply. The injection will be given to you by a health care professional. NOTE: This sheet is a summary. It may not cover all possible information. If you have questions about this medicine, talk to your doctor, pharmacist, or health care provider.  2020 Elsevier/Gold Standard (2008-07-03 18:37:56)  

## 2019-11-11 ENCOUNTER — Encounter: Payer: Self-pay | Admitting: Physical Therapy

## 2019-11-11 ENCOUNTER — Other Ambulatory Visit: Payer: Self-pay

## 2019-11-11 ENCOUNTER — Ambulatory Visit: Payer: BC Managed Care – PPO | Attending: Gynecologic Oncology | Admitting: Physical Therapy

## 2019-11-11 DIAGNOSIS — M6281 Muscle weakness (generalized): Secondary | ICD-10-CM

## 2019-11-11 DIAGNOSIS — R279 Unspecified lack of coordination: Secondary | ICD-10-CM | POA: Insufficient documentation

## 2019-11-11 NOTE — Therapy (Signed)
[place] at [facility] [address] [place], [place], [address] Phone: [phone]   Fax:  [phone]  Physical Therapy Evaluation  Patient Details  Name: GLYNDA NGO MRN: [id number] Date of Birth: 11/26/1965 Referring Provider (PT): Jeral Pinch   Encounter Date: 11/11/2019  PT End of Session - 11/11/19 1240    Visit Number  1    Date for PT Re-Evaluation  02/03/20    Authorization Type  BCBS    PT Start Time  [id number]    PT Stop Time  1225    PT Time Calculation (min)  54 min    Activity Tolerance  Patient tolerated treatment well;No increased pain    Behavior During Therapy  WFL for tasks assessed/performed       Past Medical History:  Diagnosis Date  . Anxiety   . Chronic female pelvic pain   . Complication of anesthesia    HARD TO WAKE  . Cyst of right ovary   . High-tone pelvic floor dysfunction   . History of DVT (deep vein thrombosis)    left lower leg -- post op 02/ 2003 breast reduction surgery  . History of Helicobacter pylori infection   . History of ovarian cyst   . History of PID [place]  . History of uterine fibroid   . Hypertension   . Lichen simplex chronicus    via left perianal bx/ buttocks  . PCOS (polycystic ovarian syndrome)   . Pelvic adhesions    hx extensive s/p  lysis adhesions -- total x4  . Seasonal asthma   . VIN III (vulvar intraepithelial neoplasia III)     Past Surgical History:  Procedure Laterality Date  . ABDOMINAL HYSTERECTOMY  09/14/2005   w/  Bilateral Salpingectomy  . BREAST REDUCTION SURGERY Bilateral 07/29/2001  . CARPAL TUNNEL RELEASE Bilateral right 2009;   left 2010  . D & C HYSTEROSCOPY W/ RESECTION UTERINE FIBROIDS  02/02/2005  . DX LAPAROSCOPY W/ LYSIS ADHESIONS  1994;  1995;  1996  . LAPAROSCOPIC SALPINGO OOPHERECTOMY Left 01/12/2015   Procedure: LAPAROSCOPY;  Surgeon: Eldred Manges, MD;  Location: [facility];  Service: Gynecology;  Laterality: Left;   . LAPAROTOMY N/A 01/12/2015   Procedure: LAPAROTOMY;  Surgeon: Eldred Manges, MD;  Location: [facility];  Service: Gynecology;  Laterality: N/A;  . OOPHORECTOMY Left 01/12/2015   Procedure: OOPHORECTOMY;  Surgeon: Eldred Manges, MD;  Location: [facility];  Service: Gynecology;  Laterality: Left;  . REPAIR TENDONS RIGHT WRIST W/ REVISION CARPAL TUNNEL RELEASE  2015  . VULVECTOMY N/A 09/26/2016   Procedure: WIDE LOCAL EXCISION OF VULVA;  Surgeon: Everitt Amber, MD;  Location: [facility];  Service: Gynecology;  Laterality: N/A;  . WISDOM TOOTH EXTRACTION      There were no vitals filed for this visit.   Subjective Assessment - 11/11/19 1136    Subjective  When ovulating feels like the ovary is pushing on the bladder. When urinates she has to hold her right side when she is ovulating. During ovulation has urinary urgency. NO issues with bowel movements. Most of the time urine stream is strong. Sometimes the stream is a dribble. Sometimes has to double void. Gets up 1time per night which is not normal. Interrupts her sleep. Pain with intercourse with deep penetration.    Patient Stated Goals  get relief of pain, help to [facility] the pain and adjust to the pain    Currently in Pain?  Yes    Pain Score  5    10/10 high   Pain Location  Abdomen    Pain Orientation  Right;Lower    Pain Descriptors / Indicators  Pressure;Throbbing;Aching    Pain Type  Chronic pain    Pain Onset  More than a month ago    Pain Frequency  Intermittent    Aggravating Factors   during ovulation, when urinating    Pain Relieving Factors  hold right side, heat pad    Multiple Pain Sites  Yes    Pain Score  8    Pain Location  Vagina    Pain Orientation  Mid    Pain Descriptors / Indicators  Aching   deep   Pain Type  Chronic pain    Pain Onset  More than a month ago    Pain Frequency  Intermittent    Aggravating Factors   intercourse, sit on low couch, sitting and lean back    Pain Relieving Factors  sit  up with back straight or lean to side with sitting         [facility] PT Assessment - 11/11/19 0001      Assessment   Medical Diagnosis  R10.2 Pelvic pain in female; M62.89 Pelvic floor dysfunction    Referring Provider (PT)  Jeral Pinch    Onset Date/Surgical Date  07/14/19    Prior Therapy  None      Precautions   Precautions  None      Restrictions   Weight Bearing Restrictions  No      Balance Screen   Has the patient fallen in the past 6 months  No    Has the patient had a decrease in activity level because of a fear of falling?   No    Is the patient reluctant to leave their home because of a fear of falling?   No      Home Film/video editor residence      Prior Function   Level of Independence  Independent    Vocation  Full time employment    Vocation Requirements  bus supervisor-drive, sit, walk    Leisure  walking      Cognition   Overall Cognitive Status  Within Functional Limits for tasks assessed      Observation/Other Assessments   Skin Integrity  scar on bikini lin thicker on the right      Posture/Postural Control   Posture/Postural Control  No significant limitations      ROM / Strength   AROM / PROM / Strength  AROM;PROM;Strength      AROM   Lumbar Extension  decreased by 25%    Lumbar - Right Side Bend  decreased by 25%    Lumbar - Left Side Bend  decreased by 25%      Strength   Overall Strength Comments  abdominal strength 2/5      Palpation   Spinal mobility  T5-L5 decreased movement    Palpation comment  tenderness located on right lower abdominal, mid lower abdomen, right lumbar and gluteal   lower rib cage decreased movement                 Objective measurements completed on examination: See above findings.    Pelvic Floor Special Questions - 11/11/19 0001    Prior Pregnancies  No    Currently Sexually Active  Yes    Marinoff Scale  pain interrupts  completion    Urinary Leakage  No     Fecal incontinence  No    Skin Integrity  Intact   dryness   Prolapse  None    Pelvic Floor Internal Exam  Patient confirms identification and approves PT to assess pelvic floor and treatment    Exam Type  Vaginal    Palpation  tenderness located on the right urethra pshincter, right >L levator ani, right obturaotrn     Strength  fair squeeze, definite lift               PT Education - 11/11/19 1231    Education Details  Access Code: [email or web address]; vaginal moisturizers    Person(s) Educated  Patient    Methods  Explanation;Demonstration;Verbal cues;Handout    Comprehension  Returned demonstration;Verbalized understanding       PT Short Term Goals - 11/11/19 1250      PT SHORT TERM GOAL #1   Title  independent with initial HEP    Time  4    Period  Weeks    Status  New    Target Date  12/09/19      PT SHORT TERM GOAL #2   Title  pain with deep penile penetration decreased >/= 25% due to reduction of trigger points in the pelvic floor    Time  4    Period  Weeks    Status  New    Target Date  12/09/19      PT SHORT TERM GOAL #3   Title  pain with urination decreased >/= 25% due to elongation of pelvic floor    Time  4    Period  Weeks    Status  New    Target Date  12/09/19        PT Long Term Goals - 11/11/19 1252      PT LONG TERM GOAL #1   Title  independent with advanced HEP    Time  12    Period  Weeks    Status  New    Target Date  02/03/20      PT LONG TERM GOAL #2   Title  pain with deep penile penetration decreased >/= 80%, marinoff score </= 1/3    Time  12    Period  Weeks    Status  New    Target Date  02/03/20      PT LONG TERM GOAL #3   Title  pain with urination in the right lower abdoment decreased >/= 80% so patient does not have to hold her right side    Time  12    Period  Weeks    Status  New    Target Date  02/03/20      PT LONG TERM GOAL #4   Title  able to fully empty her bladder due to full realxation of the pelvic floor  while urinating    Time  12    Period  Weeks    Status  New    Target Date  02/03/20             Plan - 11/11/19 1241    Clinical Impression Statement  Patient is a 54 year old female with pelvic pain for many years. The past 6 months she has had pain daily. Patient pain is worse during ovulation. When patient urinates she has to hold her right side until she finishes. Patient reports her pain is 5-10/10 in the right lower  abdoment. Patient reports pain with deep penile penetration at level 5/10. Marinoff score is 2/3. Pelvic floor strength is 3/5. Tendernes in bilateral levator Ani with right worse than left, right obturator internist, right gluteal, and right lumbar paraspinals. Decreased movement of T4-L5. When patient breathes she will expand her abdomen more than her lower rib cage. The scar on the suprapubic area is tight on the right. Abdominal strength is 2/5. Patient will benefit from skilled therapy to reduce her pain and elongate her muscles to improve her quality of life.    Personal Factors and Comorbidities  Comorbidity 3+;Time since onset of injury/illness/exacerbation;Sex    Examination-Activity Limitations  Toileting    Examination-Participation Restrictions  Interpersonal Relationship    Stability/Clinical Decision Making  Evolving/Moderate complexity    Clinical Decision Making  Moderate    Rehab Potential  Excellent    PT Frequency  1x / week    PT Duration  12 weeks    PT Treatment/Interventions  Cryotherapy;Electrical Stimulation;Biofeedback;Moist Heat;Ultrasound;Neuromuscular re-education;Therapeutic exercise;Therapeutic activities;Patient/family education;Manual techniques;Dry needling;Scar mobilization;Spinal Manipulations    PT Next Visit Plan  work on right lower scar, ecpand the rib cage, pelvic floor meditation, vaginal moisturizers, butterfly and squat stretch, mobilization lumbar and thoracic, talk about the wand    PT Home Exercise Plan  Access Code: [email or web address] and Agree with Plan of Care  Patient       Patient will benefit from skilled therapeutic intervention in order to improve the following deficits and impairments:  Decreased coordination, Decreased range of motion, Increased fascial restricitons, Increased muscle spasms, Decreased activity tolerance, Pain, Decreased strength  Visit Diagnosis: Muscle weakness (generalized) - Plan: PT plan of care cert/re-cert  Unspecified lack of coordination - Plan: PT plan of care cert/re-cert     Problem List Patient Active Problem List   Diagnosis Date Noted  . Adnexal mass 10/28/2019  . 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 Asthma, chronic 01/14/2015  . Ovarian cyst 01/12/2015  . Pelvic pain in female 04/01/2012  . PID (pelvic inflammatory disease)   . ASCUS (atypical squamous cells of undetermined significance) on Pap smear   . Infertility associated with anovulation   . Fibroids   . HSV-2 infection   . DVT (deep venous thrombosis) ([place])   . Hypertension 10/12/2011  . Routine gynecological examination 10/12/2011  . Headache disorder 10/12/2011  . Frontal sinus pain 10/12/2011    Earlie Counts, PT [facility] [address], [place], [place], [address] Phone: [phone]   Fax:  [phone]  Name: NESLY MERRIFIELD MRN: [id number] Date of Birth: 1965/11/29

## 2019-11-11 NOTE — Patient Instructions (Addendum)
Moisturizers . They are used in the vagina to hydrate the mucous membrane that make up the vaginal canal. . Designed to keep a more normal acid balance (ph) . Once placed in the vagina, it will last between two to three days.  . Use 2-3 times per week at bedtime  . Ingredients to avoid is glycerin and fragrance, can increase chance of infection . Should not be used just before sex due to causing irritation . Most are gels administered either in a tampon-shaped applicator or as a vaginal suppository. They are non-hormonal.   Types of Moisturizers  . Vitamin E vaginal suppositories- Whole foods, Amazon . Moist Again . Coconut oil- can break down condoms . Julva- (Do no use if on Tamoxifen) amazon . Yes moisturizer- amazon . NeuEve Silk , NeuEve Silver for menopausal or over 65 (if have severe vaginal atrophy or cancer treatments use NeuEve Silk for  1 month than move to [employer])- [employer], MapleFlower.dk . Olive and Bee intimate cream- www.oliveandbee.com.au . Mae vaginal [place] . Aloe .    Creams to use externally on the Vulva area  Albertson's (good for for cancer patients that had radiation to the area)- [place] or [employer].FlyingBasics.com.br  V-magic cream - amazon  Julva-amazon  Vital "V Wild Yam salve ( help moisturize and help with thinning vulvar area, does have Clifford by Irwin Brakeman labial moisturizer ([place],   Coconut or olive oil  aloe   Things to avoid in the vaginal area . Do not use things to irritate the vulvar area . No lotions just specialized creams for the vulva area- Neogyn, V-magic, No soaps; can use Aveeno or Calendula cleanser if needed. Must be gentle . No deodorants . No douches . Good to sleep without underwear to let the vaginal area to air out . No scrubbing: spread the lips to let warm water rinse over labias and pat dry Access Code:  [email or web address] URL: [email or web address] Date: 11/11/2019 Prepared by: Earlie Counts  Exercises Seated Piriformis Stretch with Trunk Bend - 2 x daily - 7 x weekly - 1 sets - 10 reps - 30 sec hold Seated Hip Flexor Stretch - 2 x daily - 7 x weekly - 1 sets - 10 reps - 30 sec hold Seated Hamstring Stretch - 2 x daily - 7 x weekly - 1 sets - 10 reps - 30 sec hold Seated Hip Adductor Stretch - 2 x daily - 7 x weekly - 1 sets - 10 reps - 30 sec hold Seated Plantar Fascia Mobilization with Small Ball - 1 x daily - 7 x weekly - 1 sets - 10 reps [facility] [address], [place], [place] [address] Phone # [phone] Fax [phone]

## 2019-11-18 ENCOUNTER — Ambulatory Visit: Payer: BC Managed Care – PPO | Admitting: Physical Therapy

## 2019-11-26 ENCOUNTER — Telehealth: Payer: Self-pay | Admitting: *Deleted

## 2019-11-26 NOTE — Telephone Encounter (Signed)
Per Dr Berline Lopes called Cecille Rubin at Dr Berneta Sages office regarding her [place] in August. The ofice has a note to schedule scan in August and to fax report to office

## 2019-12-02 ENCOUNTER — Encounter: Payer: BC Managed Care – PPO | Attending: Gynecologic Oncology | Admitting: Physical Therapy

## 2019-12-02 ENCOUNTER — Encounter: Payer: Self-pay | Admitting: Physical Therapy

## 2019-12-02 ENCOUNTER — Other Ambulatory Visit: Payer: Self-pay

## 2019-12-02 DIAGNOSIS — M6281 Muscle weakness (generalized): Secondary | ICD-10-CM

## 2019-12-02 DIAGNOSIS — R279 Unspecified lack of coordination: Secondary | ICD-10-CM | POA: Diagnosis present

## 2019-12-02 NOTE — Therapy (Addendum)
[place] at [facility] [address], [place], [place], [address] Phone: [phone]   Fax:  [phone]  Physical Therapy Treatment  Patient Details  Name: [name] MRN: [id number] Date of Birth: 09-Feb-1966 Referring Provider (PT): Jeral Pinch   Encounter Date: 12/02/2019  PT End of Session - 12/02/19 1608    Visit Number  2    Date for PT Re-Evaluation  02/03/20    Authorization Type  BCBS    PT Start Time  1600    PT Stop Time  1645    PT Time Calculation (min)  45 min    Activity Tolerance  Patient tolerated treatment well;No increased pain    Behavior During Therapy  WFL for tasks assessed/performed       Past Medical History:  Diagnosis Date  . Anxiety   . Chronic female pelvic pain   . Complication of anesthesia    HARD TO WAKE  . Cyst of right ovary   . High-tone pelvic floor dysfunction   . History of DVT (deep vein thrombosis)    left lower leg -- post op 02/ 2003 breast reduction surgery  . History of Helicobacter pylori infection   . History of ovarian cyst   . History of PID [place]  . History of uterine fibroid   . Hypertension   . Lichen simplex chronicus    via left perianal bx/ buttocks  . PCOS (polycystic ovarian syndrome)   . Pelvic adhesions    hx extensive s/p  lysis adhesions -- total x4  . Seasonal asthma   . VIN III (vulvar intraepithelial neoplasia III)     Past Surgical History:  Procedure Laterality Date  . ABDOMINAL HYSTERECTOMY  09/14/2005   w/  Bilateral Salpingectomy  . BREAST REDUCTION SURGERY Bilateral 07/29/2001  . CARPAL TUNNEL RELEASE Bilateral right 2009;   left 2010  . D & C HYSTEROSCOPY W/ RESECTION UTERINE FIBROIDS  02/02/2005  . DX LAPAROSCOPY W/ LYSIS ADHESIONS  1994;  1995;  1996  . LAPAROSCOPIC SALPINGO OOPHERECTOMY Left 01/12/2015   Procedure: LAPAROSCOPY;  Surgeon: Eldred Manges, MD;  Location: [facility];  Service: Gynecology;  Laterality: Left;   . LAPAROTOMY N/A 01/12/2015   Procedure: LAPAROTOMY;  Surgeon: Eldred Manges, MD;  Location: [facility];  Service: Gynecology;  Laterality: N/A;  . OOPHORECTOMY Left 01/12/2015   Procedure: OOPHORECTOMY;  Surgeon: Eldred Manges, MD;  Location: [facility];  Service: Gynecology;  Laterality: Left;  . REPAIR TENDONS RIGHT WRIST W/ REVISION CARPAL TUNNEL RELEASE  2015  . VULVECTOMY N/A 09/26/2016   Procedure: WIDE LOCAL EXCISION OF VULVA;  Surgeon: Everitt Amber, MD;  Location: [facility];  Service: Gynecology;  Laterality: N/A;  . WISDOM TOOTH EXTRACTION      There were no vitals filed for this visit.  Subjective Assessment - 12/02/19 1605    Subjective  Takes me 2-3 days to recover from intercourse. The shot worked so I am not in as much pain just achiness.    Patient Stated Goals  get relief of pain, help to [facility] the pain and adjust to the pain    Currently in Pain?  Yes    Pain Score  7     Pain Location  Abdomen    Pain Orientation  Right;Left    Pain Descriptors / Indicators  Pressure;Throbbing;Aching    Pain Onset  More than a month ago    Pain Frequency  Intermittent  Aggravating Factors   during ovulation    Pain Relieving Factors  heat pad, hold right side    Multiple Pain Sites  Yes    Pain Score  10    Pain Location  Vagina    Pain Orientation  Mid    Pain Descriptors / Indicators  Aching   deep   Pain Type  Chronic pain    Pain Onset  More than a month ago    Pain Frequency  Intermittent    Aggravating Factors   intercourse, sit on low couch, sitting and lean back    Pain Relieving Factors  no intercourse, sit up with back straight or lean to side with sitting                        [facility] Adult PT Treatment/Exercise - 12/02/19 0001      Self-Care   Self-Care  Other Self-Care Comments    Other Self-Care Comments   education on pelvic floor meditation when she has increased pain, how to massage the pelvic floor with a wand or her thumb  internally      Lumbar Exercises: Stretches   Other Lumbar Stretch Exercise  butterfly stretch; lay on foam roll horizontal to thoracic with extension to mobilize the spine and increased lower rib cage expansion      Manual Therapy   Manual Therapy  Myofascial release;Soft tissue mobilization;Joint mobilization    Joint Mobilization  T5-L2 PA and rotational mobilization to improve mobility of spine and rib cage    Soft tissue mobilization  scar massage suprapubically, along the pubic bone; abdominal massage to promote peristalic motion, release of the rectus ; release of the right psoas    Myofascial Release  release around the umbilicus, bladder, pubovesical ligaments             PT Education - 12/02/19 1655    Education Details  abdominal massage; scar massage, massage around the pubic bone; where to get the pelvic floor wand; you tube video for pelvic floor meditation    Person(s) Educated  Patient    Methods  Explanation;Demonstration;Verbal cues;Handout    Comprehension  Returned demonstration;Verbalized understanding       PT Short Term Goals - 12/02/19 1704      PT SHORT TERM GOAL #1   Title  independent with initial HEP    Time  4    Period  Weeks    Status  Achieved      PT SHORT TERM GOAL #2   Title  pain with deep penile penetration decreased >/= 25% due to reduction of trigger points in the pelvic floor    Time  4    Period  Weeks    Status  On-going      PT SHORT TERM GOAL #3   Title  pain with urination decreased >/= 25% due to elongation of pelvic floor    Time  4    Period  Weeks    Status  On-going        PT Long Term Goals - 11/11/19 1252      PT LONG TERM GOAL #1   Title  independent with advanced HEP    Time  12    Period  Weeks    Status  New    Target Date  02/03/20      PT LONG TERM GOAL #2   Title  pain with deep penile penetration decreased >/= 80%,  marinoff score </= 1/3    Time  12    Period  Weeks    Status  New    Target  Date  02/03/20      PT LONG TERM GOAL #3   Title  pain with urination in the right lower abdoment decreased >/= 80% so patient does not have to hold her right side    Time  12    Period  Weeks    Status  New    Target Date  02/03/20      PT LONG TERM GOAL #4   Title  able to fully empty her bladder due to full realxation of the pelvic floor while urinating    Time  12    Period  Weeks    Status  New    Target Date  02/03/20            Plan - 12/02/19 1637    Clinical Impression Statement  Patient has restrictions of the abdominal scar and abdominal tissue and needs manual work to release it. Patient has decreased rib mobility and scar from a breast reduction making it difficult to breath diaphragmatically to relax the pelvic floor. She will have pelvic pain days after having intercourse. Patient had increased mobility and felt more relaxed after her visit. Patient will benefit from skilled therapy to reduce her pain and elongate her muscles to improve her quality of life.    Personal Factors and Comorbidities  Comorbidity 3+;Time since onset of injury/illness/exacerbation;Sex    Examination-Activity Limitations  Toileting    Examination-Participation Restrictions  Interpersonal Relationship    Stability/Clinical Decision Making  Evolving/Moderate complexity    Rehab Potential  Excellent    PT Frequency  1x / week    PT Duration  12 weeks    PT Treatment/Interventions  Cryotherapy;Electrical Stimulation;Biofeedback;Moist Heat;Ultrasound;Neuromuscular re-education;Therapeutic exercise;Therapeutic activities;Patient/family education;Manual techniques;Dry needling;Scar mobilization;Spinal Manipulations    PT Next Visit Plan  continues work on lower abdominal scar, internal soft tissue work, trunk rotation;; see if she has gotten the wand; ask about pain with urination    PT Home Exercise Plan  Access Code: [email or web address]    Recommended Other Services  MD signed initial evaluation     Consulted and Agree with Plan of Care  Patient       Patient will benefit from skilled therapeutic intervention in order to improve the following deficits and impairments:  Decreased coordination, Decreased range of motion, Increased fascial restricitons, Increased muscle spasms, Decreased activity tolerance, Pain, Decreased strength  Visit Diagnosis: Muscle weakness (generalized)  Unspecified lack of coordination     Problem List Patient Active Problem List   Diagnosis Date Noted  . Adnexal mass 10/28/2019  . 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 Asthma, chronic 01/14/2015  . Ovarian cyst 01/12/2015  . Pelvic pain in female 04/01/2012  . PID (pelvic inflammatory disease)   . ASCUS (atypical squamous cells of undetermined significance) on Pap smear   . Infertility associated with anovulation   . Fibroids   . HSV-2 infection   . DVT (deep venous thrombosis) ([place])   . Hypertension 10/12/2011  . Routine gynecological examination 10/12/2011  . Headache disorder 10/12/2011  . Frontal sinus pain 10/12/2011    Earlie Counts, PT 12/02/19 5:06 PM   [facility] [address] [place], [place], [address] Phone: [phone]   Fax:  [phone]  Name: LADELLE TEODORO MRN: [id number] Date of Birth: 04-03-1966  PHYSICAL THERAPY DISCHARGE SUMMARY  Visits from Start of Care: 2  Current functional level related to goals / functional outcomes: See above. Patient has no showed for her last 2 visits. She is being discharged due to our attendance policy.    Remaining deficits: See above.    Education / Equipment: HEP Plan:                                                    Patient goals were not met. Patient is being discharged due to not returning since the last visit. Thank you for the referral. Earlie Counts, PT 02/11/20 10:10 AM   ?????

## 2019-12-02 NOTE — Patient Instructions (Addendum)
About Abdominal Massage  Abdominal massage, also called external colon massage, is a self-treatment circular massage technique that can reduce and eliminate gas and ease constipation. The colon naturally contracts in waves in a clockwise direction starting from inside the right hip, moving up toward the ribs, across the belly, and down inside the left hip.  When you perform circular abdominal massage, you help stimulate your colons normal wave pattern of movement called peristalsis.  It is most beneficial when done after eating.  Positioning You can practice abdominal massage with oil while lying down, or in the shower with soap.  Some people find that it is just as effective to do the massage through clothing while sitting or standing.  How to Massage Start by placing your finger tips or knuckles on your right side, just inside your hip bone.   Make small circular movements while you move upward toward your rib cage.    Once you reach the bottom right side of your rib cage, take your circular movements across to the left side of the bottom of your rib cage.   Next, move downward until you reach the inside of your left hip bone.  This is the path your feces travel in your colon.  Continue to perform your abdominal massage in this pattern for 10 minutes each day.     You can apply as much pressure as is comfortable in your massage.  Start gently and build pressure as you continue to practice.  Notice any areas of pain as you massage; areas of slight pain may be relieved as you massage, but if you have areas of significant or intense pain, consult with your healthcare provider.  Other Considerations  General physical activity including bending and stretching can have a beneficial massage-like effect on the colon.  Deep breathing can also stimulate the colon because breathing deeply activates the same nervous system that supplies the colon.    Abdominal massage should always be used in  combination with a bowel-conscious diet that is high in the proper type of fiber for you, fluids (primarily water), and a regular exercise program.  Along the pubic bone massage up and down for 5 minutes 1 time per day    Guided Meditation for Pelvic Floor Relaxation   [employer] you tube Do when you are in pain, increased stress, increased tension  [facility] [address] [place], [place] [address] Phone # [phone] Fax [phone]

## 2019-12-09 ENCOUNTER — Encounter: Payer: Self-pay | Admitting: Physical Therapy

## 2019-12-15 ENCOUNTER — Telehealth: Payer: Self-pay

## 2019-12-15 NOTE — Telephone Encounter (Addendum)
[name] states that she has been experiencing increased right sided abdominal pain.  It is achy and throbbing more consistently the last couple of days. Pain currently a 7/10.  She is not able to continue with pelvic floor therapy due to the cost as she has not met her deductible for this year. She has been using tylenol. Ibuprofen upsets her stomach even with food. Reviewed with Dr. Berline Lopes. Told [name] that Dr. Berline Lopes recommends trying aleve alternating with tylenol.  Use a heating pad as well to back or abdomen. Told [name] to call back in a few days if not effective. Pt verbalized understanding.

## 2019-12-16 ENCOUNTER — Encounter: Payer: Self-pay | Admitting: Physical Therapy

## 2019-12-23 ENCOUNTER — Encounter: Payer: Self-pay | Admitting: Physical Therapy

## 2020-01-23 ENCOUNTER — Telehealth: Payer: Self-pay | Admitting: *Deleted

## 2020-01-23 NOTE — Telephone Encounter (Signed)
Called the patient and left a message to call the office back. Patient needs to be scheduled for an [place] scan and MD visit; same day

## 2020-01-26 ENCOUNTER — Telehealth: Payer: Self-pay | Admitting: *Deleted

## 2020-01-26 NOTE — Telephone Encounter (Signed)
Called and scheduled the patient for a follow up US scan. Then scheduled a MD visit and injection after the same day. Called and gave the patient the appt information

## 2020-02-03 ENCOUNTER — Ambulatory Visit (HOSPITAL_COMMUNITY): Payer: BC Managed Care – PPO

## 2020-02-03 ENCOUNTER — Ambulatory Visit: Payer: BC Managed Care – PPO | Admitting: Gynecologic Oncology

## 2020-02-03 ENCOUNTER — Inpatient Hospital Stay: Payer: BC Managed Care – PPO

## 2020-02-03 ENCOUNTER — Telehealth: Payer: Self-pay | Admitting: *Deleted

## 2020-02-03 NOTE — Telephone Encounter (Signed)
Called and rescheduled US/MD visit/injection from 8/25 to 9/10

## 2020-02-10 ENCOUNTER — Telehealth: Payer: Self-pay | Admitting: Physical Therapy

## 2020-02-10 ENCOUNTER — Ambulatory Visit: Payer: BC Managed Care – PPO | Admitting: Physical Therapy

## 2020-02-10 NOTE — Telephone Encounter (Signed)
Called patient about her missed appointment today at 16:00. Left a message.  Earlie Counts, PT @8 /17/2021@ 4:16 PM

## 2020-02-18 ENCOUNTER — Ambulatory Visit: Payer: BC Managed Care – PPO

## 2020-02-18 ENCOUNTER — Ambulatory Visit (HOSPITAL_COMMUNITY): Payer: BC Managed Care – PPO

## 2020-02-18 ENCOUNTER — Ambulatory Visit: Payer: BC Managed Care – PPO | Admitting: Gynecologic Oncology

## 2020-03-04 NOTE — Progress Notes (Signed)
Gynecologic Oncology Return Clinic Visit  03/05/20  Reason for Visit: follow-up ultrasound in the setting of an adnexal mass  Treatment History: Her history is notable for hysterectomy (lsc) at age 54 for fibroid uterus. Her left ovary was removed in 2016 secondary to a persistent mass. The surgery took 5.5 hours and was complicated by significant adhesive disease and disruption of the sigmoid serosa requiring intra-op general surgery consultation and repair.  Her history is also notable for VIN3 treated with WLE in 2018. She had a DVT after surgery almost 20 years ago treated with approximately 6 months of Coumadin.  Pelvic ultrasound 09/25/19: No free pelvic fluid. Mass identified within the RIGHT adnexa, 6.0 x 6.0 x 5.6 cm, volume 105 mL, which may represent RIGHT ovary containing a complicated cystic lesion measuring 4.5 x 5.7 x 4.6 cm. This lesion contains 2 simple appearing locule separated by a 2 mm thick septation of uniform thickness. Asymmetric tissues to the superior aspect of the lesion could represent residual ovary. No definite mural nodules within the cystic locules.  Pelvic ultrasound 10/2017: Right ovary Measurements: 6.6 x 2.3 x 5.5 cm. A complex cystic lesion is seen measuring 2.6 x 1.5 by 3.0 cm. This has solid and cystic areas although no definite blood flow seen within this lesion on color Doppler ultrasound. This has indeterminate characteristics, but likely represents a corpus luteum.  Interval History: The patient reports overall doing well since her last visit.  She denies any significant change to her symptoms.  She still has intermittent pelvic pain, often right sided.  She had some side effects that started after her Depo-Lupron injection including waking up sweating, increased moodiness and increased urinary frequency.  These have improved some over time since her injection.  She was able to go to pelvic floor physical therapy 3 or 4 times which she found very helpful.   It was too expensive unfortunately, so she could not continue going.  She felt that she got some very good information from the therapist and has been using these when she has episodes of pelvic pain.  Past Medical/Surgical History: Past Medical History:  Diagnosis Date  . Anxiety   . Chronic female pelvic pain   . Complication of anesthesia    HARD TO WAKE  . Cyst of right ovary   . High-tone pelvic floor dysfunction   . History of DVT (deep vein thrombosis)    left lower leg -- post op 02/ 2003 breast reduction surgery  . History of Helicobacter pylori infection   . History of ovarian cyst   . History of PID [place]  . History of uterine fibroid   . Hypertension   . Lichen simplex chronicus    via left perianal bx/ buttocks  . PCOS (polycystic ovarian syndrome)   . Pelvic adhesions    hx extensive s/p  lysis adhesions -- total x4  . Seasonal asthma   . VIN III (vulvar intraepithelial neoplasia III)     Past Surgical History:  Procedure Laterality Date  . ABDOMINAL HYSTERECTOMY  09/14/2005   w/  Bilateral Salpingectomy  . BREAST REDUCTION SURGERY Bilateral 07/29/2001  . CARPAL TUNNEL RELEASE Bilateral right 2009;   left 2010  . D & C HYSTEROSCOPY W/ RESECTION UTERINE FIBROIDS  02/02/2005  . DX LAPAROSCOPY W/ LYSIS ADHESIONS  1994;  1995;  1996  . LAPAROSCOPIC SALPINGO OOPHERECTOMY Left 01/12/2015   Procedure: LAPAROSCOPY;  Surgeon: Eldred Manges, MD;  Location: [facility];  Service: Gynecology;  Laterality: Left;  . LAPAROTOMY N/A 01/12/2015   Procedure: LAPAROTOMY;  Surgeon: Eldred Manges, MD;  Location: [facility];  Service: Gynecology;  Laterality: N/A;  . OOPHORECTOMY Left 01/12/2015   Procedure: OOPHORECTOMY;  Surgeon: Eldred Manges, MD;  Location: [facility];  Service: Gynecology;  Laterality: Left;  . REPAIR TENDONS RIGHT WRIST W/ REVISION CARPAL TUNNEL RELEASE  2015  . VULVECTOMY N/A 09/26/2016   Procedure: WIDE LOCAL EXCISION OF VULVA;  Surgeon: Everitt Amber, MD;   Location: [facility];  Service: Gynecology;  Laterality: N/A;  . WISDOM TOOTH EXTRACTION      Family History  Problem Relation Age of Onset  . Cancer Paternal Grandmother 15       OVARIAN  . Diabetes Maternal Aunt   . Cancer Paternal Aunt 51       CERVICAL  . Stroke Maternal Grandmother   . Hypertension Mother   . Cancer Mother        breast  . Hypertension Father   . Heart attack Father     Social History   Socioeconomic History  . Marital status: Married    Spouse name: Not on file  . Number of children: Not on file  . Years of education: Not on file  . Highest education level: Not on file  Occupational History  . Not on file  Tobacco Use  . Smoking status: Never Smoker  . Smokeless tobacco: Never Used  Vaping Use  . Vaping Use: Never used  Substance and Sexual Activity  . Alcohol use: No  . Drug use: No  . Sexual activity: Yes    Partners: Male    Birth control/protection: Surgical    Comment: HYST   Other Topics Concern  . Not on file  Social History Narrative  . Not on file   Social Determinants of Health   Financial Resource Strain:   . Difficulty of Paying Living Expenses: Not on file  Food Insecurity:   . Worried About Charity fundraiser in the Last Year: Not on file  . Ran Out of Food in the Last Year: Not on file  Transportation Needs:   . Lack of Transportation (Medical): Not on file  . Lack of Transportation (Non-Medical): Not on file  Physical Activity:   . Days of Exercise per Week: Not on file  . Minutes of Exercise per Session: Not on file  Stress:   . Feeling of Stress : Not on file  Social Connections:   . Frequency of Communication with Friends and Family: Not on file  . Frequency of Social Gatherings with Friends and Family: Not on file  . Attends Religious Services: Not on file  . Active Member of Clubs or Organizations: Not on file  . Attends Archivist Meetings: Not on file  . Marital Status: Not on  file    Current Medications:  Current Outpatient Medications:  .  albuterol (PROVENTIL HFA;VENTOLIN HFA) 108 (90 BASE) MCG/ACT inhaler, Inhale 2 puffs into the lungs every 6 (six) hours as needed for wheezing or shortness of breath., Disp: , Rfl:  .  Cyanocobalamin (VITAMIN B 12 PO), Take 1,000 m by mouth., Disp: , Rfl:  .  INTRAROSA 6.5 MG INST, SMARTSIG:1 Insert Vaginal Every Night, Disp: , Rfl:  .  potassium chloride (KLOR-CON) 10 MEQ tablet, Take 10 mEq by mouth daily., Disp: , Rfl:  .  telmisartan-hydrochlorothiazide (MICARDIS HCT) 80-25 MG tablet, Take 1 tablet by mouth daily., Disp: , Rfl:  .  traMADol (ULTRAM) 50 MG tablet, Take 50-100 mg by mouth every 6 (six) hours as needed., Disp: , Rfl:  .  vitamin E 1000 UNIT capsule, Take 1,000 Units by mouth every morning. , Disp: , Rfl:   Review of Systems: Positive for pain with intercourse and pelvic pain. Denies appetite changes, fevers, chills, fatigue, unexplained weight changes. Denies hearing loss, neck lumps or masses, mouth sores, ringing in ears or voice changes. Denies cough or wheezing.  Denies shortness of breath. Denies chest pain or palpitations. Denies leg swelling. Denies abdominal distention, pain, blood in stools, constipation, diarrhea, nausea, vomiting, or early satiety. Denies dysuria, frequency, hematuria or incontinence. Denies hot flashes, vaginal bleeding or vaginal discharge.   Denies joint pain, back pain or muscle pain/cramps. Denies itching, rash, or wounds. Denies dizziness, headaches, numbness or seizures. Denies swollen lymph nodes or glands, denies easy bruising or bleeding. Denies anxiety, depression, confusion, or decreased concentration.  Physical Exam: BP 126/73 (BP Location: Left Arm)   Pulse 73   Temp 98.2 F (36.8 C) (Tympanic)   Resp 18   Wt 206 lb (93.4 kg)   SpO2 100%   BMI 36.49 kg/m  General: Alert, oriented, no acute distress. HEENT: Normocephalic, atraumatic, sclera  anicteric. Chest: Unlabored breathing on room air .  Laboratory & Radiologic Studies: Pelvic ultrasound 9/10:  1. Interval resolution of the cystic lesion with thick septation previously seen in the right ovary. It is possible that the mild prominence in size of the ovary for a perimenopausal female on today's exam could reflect some isoechoic improving residuum of the prior lesion, but in general the near complete resolution indicates a benign etiology.  Assessment & Plan: Peri-menopausal patient with history of PCOS with history of a mostly simple appearing right adnexal mass that has now resolved on imaging.   Patient underwent repeat ultrasound today, which shows resolution of her right adnexal mass.  At the time of her last visit in May, we had discussed options for ovarian suppression and the patient received an injection of Depo-Lupron.  She had some side effects related to this but overall tolerated it well and her symptoms are improving.  We discussed a second injection today for continued ovarian suppression, but her preference is to hold off on another Lupron injection.  She feels that she knows when she develops a cyst due to her symptoms.  I encouraged her to call Dr. Berneta Sages office if she develops these again.  I am hopeful that she will enter menopause naturally in the near future but feel that we could use ovarian suppression again if she were to redevelop a symptomatic ovarian cyst.  25 minutes of total time was spent for this patient encounter, including preparation, face-to-face counseling with the patient and coordination of care, and documentation of the encounter.  Jeral Pinch, MD  Division of Gynecologic Oncology  Department of Obstetrics and Gynecology  [facility]

## 2020-03-05 ENCOUNTER — Encounter: Payer: Self-pay | Admitting: Gynecologic Oncology

## 2020-03-05 ENCOUNTER — Other Ambulatory Visit: Payer: Self-pay

## 2020-03-05 ENCOUNTER — Ambulatory Visit (HOSPITAL_COMMUNITY)
Admission: RE | Admit: 2020-03-05 | Discharge: 2020-03-05 | Disposition: A | Discharge status: Home / Self Care | Payer: BC Managed Care – PPO | Source: Ambulatory Visit | Attending: Gynecologic Oncology | Admitting: Gynecologic Oncology

## 2020-03-05 ENCOUNTER — Inpatient Hospital Stay: Payer: BC Managed Care – PPO | Attending: Gynecologic Oncology | Admitting: Gynecologic Oncology

## 2020-03-05 ENCOUNTER — Inpatient Hospital Stay: Payer: BC Managed Care – PPO

## 2020-03-05 VITALS — BP 126/73 | HR 73 | Temp 98.2°F | Resp 18 | Wt 206.0 lb

## 2020-03-05 DIAGNOSIS — R102 Pelvic and perineal pain: Secondary | ICD-10-CM | POA: Diagnosis not present

## 2020-03-05 DIAGNOSIS — Z79899 Other long term (current) drug therapy: Secondary | ICD-10-CM | POA: Insufficient documentation

## 2020-03-05 DIAGNOSIS — Z86718 Personal history of other venous thrombosis and embolism: Secondary | ICD-10-CM | POA: Insufficient documentation

## 2020-03-05 DIAGNOSIS — Z90710 Acquired absence of both cervix and uterus: Secondary | ICD-10-CM | POA: Insufficient documentation

## 2020-03-05 DIAGNOSIS — E282 Polycystic ovarian syndrome: Secondary | ICD-10-CM | POA: Insufficient documentation

## 2020-03-05 DIAGNOSIS — N9489 Other specified conditions associated with female genital organs and menstrual cycle: Secondary | ICD-10-CM | POA: Insufficient documentation

## 2020-03-05 DIAGNOSIS — I10 Essential (primary) hypertension: Secondary | ICD-10-CM | POA: Diagnosis not present

## 2020-03-05 NOTE — Patient Instructions (Signed)
Great news! The cyst on your ovary has resolved. I don't recommend any further imaging unless you were to develop new symptoms. If that happens, please called Dr. Berneta Sages office.

## 2020-10-25 ENCOUNTER — Other Ambulatory Visit: Payer: Self-pay | Admitting: Internal Medicine

## 2020-10-25 ENCOUNTER — Other Ambulatory Visit: Payer: Self-pay

## 2020-10-25 ENCOUNTER — Ambulatory Visit
Admission: RE | Admit: 2020-10-25 | Discharge: 2020-10-25 | Disposition: A | Discharge status: Home / Self Care | Payer: BC Managed Care – PPO | Source: Ambulatory Visit | Attending: Internal Medicine | Admitting: Internal Medicine

## 2020-10-25 DIAGNOSIS — M5440 Lumbago with sciatica, unspecified side: Secondary | ICD-10-CM

## 2020-11-26 ENCOUNTER — Other Ambulatory Visit: Payer: Self-pay | Admitting: Internal Medicine

## 2020-11-26 ENCOUNTER — Ambulatory Visit
Admission: RE | Admit: 2020-11-26 | Discharge: 2020-11-26 | Disposition: A | Discharge status: Home / Self Care | Payer: BC Managed Care – PPO | Source: Ambulatory Visit | Attending: Internal Medicine | Admitting: Internal Medicine

## 2020-11-26 ENCOUNTER — Other Ambulatory Visit: Payer: Self-pay

## 2020-11-26 DIAGNOSIS — R52 Pain, unspecified: Secondary | ICD-10-CM

## 2021-08-17 ENCOUNTER — Other Ambulatory Visit: Payer: Self-pay | Admitting: Gastroenterology

## 2021-08-17 ENCOUNTER — Ambulatory Visit
Admission: RE | Admit: 2021-08-17 | Discharge: 2021-08-17 | Disposition: A | Discharge status: Home / Self Care | Payer: BC Managed Care – PPO | Source: Ambulatory Visit | Attending: Gastroenterology | Admitting: Gastroenterology

## 2021-08-17 DIAGNOSIS — R079 Chest pain, unspecified: Secondary | ICD-10-CM

## 2021-10-31 ENCOUNTER — Other Ambulatory Visit: Payer: Self-pay | Admitting: Obstetrics and Gynecology

## 2022-08-29 ENCOUNTER — Other Ambulatory Visit: Payer: Self-pay | Admitting: Obstetrics and Gynecology

## 2023-06-25 ENCOUNTER — Encounter: Payer: Self-pay | Admitting: Gynecologic Oncology

## 2023-06-30 ENCOUNTER — Encounter (HOSPITAL_BASED_OUTPATIENT_CLINIC_OR_DEPARTMENT_OTHER): Payer: Self-pay | Admitting: Urology

## 2023-06-30 ENCOUNTER — Other Ambulatory Visit: Payer: Self-pay

## 2023-06-30 ENCOUNTER — Encounter: Payer: Self-pay | Admitting: Gynecologic Oncology

## 2023-06-30 ENCOUNTER — Emergency Department (HOSPITAL_BASED_OUTPATIENT_CLINIC_OR_DEPARTMENT_OTHER)
Admission: EM | Admit: 2023-06-30 | Discharge: 2023-06-30 | Disposition: A | Discharge status: Home / Self Care | Payer: 59 | Attending: Emergency Medicine | Admitting: Emergency Medicine

## 2023-06-30 ENCOUNTER — Emergency Department (HOSPITAL_BASED_OUTPATIENT_CLINIC_OR_DEPARTMENT_OTHER): Payer: 59

## 2023-06-30 DIAGNOSIS — Z79899 Other long term (current) drug therapy: Secondary | ICD-10-CM | POA: Diagnosis not present

## 2023-06-30 DIAGNOSIS — I10 Essential (primary) hypertension: Secondary | ICD-10-CM | POA: Diagnosis not present

## 2023-06-30 DIAGNOSIS — R1011 Right upper quadrant pain: Secondary | ICD-10-CM | POA: Insufficient documentation

## 2023-06-30 DIAGNOSIS — R110 Nausea: Secondary | ICD-10-CM | POA: Diagnosis not present

## 2023-06-30 DIAGNOSIS — R6883 Chills (without fever): Secondary | ICD-10-CM | POA: Insufficient documentation

## 2023-06-30 LAB — COMPREHENSIVE METABOLIC PANEL
ALT: 17 U/L (ref 0–44)
AST: 16 U/L (ref 15–41)
Albumin: 3.9 g/dL (ref 3.5–5.0)
Alkaline Phosphatase: 75 U/L (ref 38–126)
Anion gap: 7 (ref 5–15)
BUN: 8 mg/dL (ref 6–20)
CO2: 28 mmol/L (ref 22–32)
Calcium: 9.3 mg/dL (ref 8.9–10.3)
Chloride: 107 mmol/L (ref 98–111)
Creatinine, Ser: 0.75 mg/dL (ref 0.44–1.00)
GFR, Estimated: >60 mL/min (ref >60)
Glucose, Bld: 84 mg/dL (ref 70–99)
Potassium: 3.8 mmol/L (ref 3.5–5.1)
Sodium: 142 mmol/L (ref 135–145)
Total Bilirubin: 0.6 mg/dL (ref 0.0–1.2)
Total Protein: 7.1 g/dL (ref 6.5–8.1)

## 2023-06-30 LAB — URINALYSIS, ROUTINE W REFLEX MICROSCOPIC
Bilirubin Urine: NEGATIVE
Glucose, UA: NEGATIVE mg/dL
Hgb urine dipstick: NEGATIVE
Ketones, ur: NEGATIVE mg/dL
Leukocytes,Ua: NEGATIVE
Nitrite: NEGATIVE
Protein, ur: NEGATIVE mg/dL
Specific Gravity, Urine: 1.013 (ref 1.005–1.030)
pH: 5.5 (ref 5.0–8.0)

## 2023-06-30 LAB — CBC
HCT: 44 % (ref 36.0–46.0)
Hemoglobin: 14.4 g/dL (ref 12.0–15.0)
MCH: 30.1 pg (ref 26.0–34.0)
MCHC: 32.7 g/dL (ref 30.0–36.0)
MCV: 92.1 fL (ref 80.0–100.0)
Platelets: 307 10*3/uL (ref 150–400)
RBC: 4.78 MIL/uL (ref 3.87–5.11)
RDW: 13.2 % (ref 11.5–15.5)
WBC: 5.5 10*3/uL (ref 4.0–10.5)
nRBC: 0 % (ref 0.0–0.2)

## 2023-06-30 LAB — LIPASE, BLOOD: Lipase: 11 U/L (ref 11–51)

## 2023-06-30 MED ORDER — IOHEXOL 300 MG/ML  SOLN
100.0000 mL | Freq: Once | INTRAMUSCULAR | Status: AC | PRN
  Administered 2023-06-30: 100 mL via INTRAVENOUS

## 2023-06-30 MED ORDER — MORPHINE SULFATE (PF) 4 MG/ML IV SOLN
4.0000 mg | Freq: Once | INTRAVENOUS | Status: AC
  Administered 2023-06-30: 4 mg via INTRAVENOUS
  Filled 2023-06-30: qty 1

## 2023-06-30 NOTE — ED Provider Notes (Signed)
 Care assumed from Lorin Roemhildt, PA-C at shift change. Please see their note for further information.   Briefly: Patient with right upper abdominal pain x 1 week. No nausea, vomiting, or diarrhea. Initially took Miralax thinking she might be constipated, however she had a large bowel movement with no improvement in her pain. Pain worsened today prompting her visit.   Plan: TTP RUQ, US  obtained which was unremarkable. CT pending and will determine dispo.  Labs benign.  7:30PM: CT has resulted and reveals no acute findings.  I personally reviewed and interpreted this imaging and agree with radiology interpretation.   Upon reassessment, patient feels improved.  Plan for discharge per previous providers recommendations. Evaluation and diagnostic testing in the emergency department does not suggest an emergent condition requiring admission or immediate intervention beyond what has been performed at this time.  Plan for discharge with close PCP follow-up.  Patient is understanding and amenable with plan, educated on red flag symptoms that would prompt immediate return.  Patient discharged in stable condition.      Delmer Kowalski A, PA-C 06/30/23 2030    Tegeler, Lonni PARAS, MD 06/30/23 2200

## 2023-06-30 NOTE — Discharge Instructions (Addendum)
 You were seen in the emergency department today for right upper quadrant abdominal pain.  As we discussed your blood work and imaging all looked reassuring.  We did not find an emergent cause of your pain.  It is possible it still could be from excess gas or irritability of your GI tract.  I recommend taking over-the-counter medications such as Pepto-Bismol or Gas-X.  Continue to monitor how you're doing and return to the ER for new or worsening symptoms.

## 2023-06-30 NOTE — ED Notes (Signed)
 Pt states took BP meds just PTA. Hypertensive at triage

## 2023-06-30 NOTE — ED Provider Notes (Signed)
 [place] EMERGENCY DEPARTMENT AT [facility] Provider Note   CSN: [id number] Arrival date & time: 06/30/23  1244     History  Chief Complaint  Patient presents with   Abdominal Pain    [name] is a 58 y.o. female with history of PCOS, chronic pelvic pain, pelvic adhesions, hypertension, anxiety, uterine fibroids, who presents the emergency department complaining of right upper quadrant abdominal pain for the past week.  Patient had been somewhat ignoring it, until she developed chills and nausea starting this morning.  States she was very uncomfortable last night.  No aggravating or alleviating factors identified.  She was initially hypertensive in triage, but reported to the triage nurse that she had just taken her blood pressure meds prior to ER arrival.   Abdominal Pain Associated symptoms: chills and nausea        Home Medications Prior to Admission medications   Medication Sig Start Date End Date Taking? Authorizing Provider  albuterol  (PROVENTIL  HFA;VENTOLIN  HFA) 108 (90 BASE) MCG/ACT inhaler Inhale 2 puffs into the lungs every 6 (six) hours as needed for wheezing or shortness of breath.    [provider]  Cyanocobalamin (VITAMIN B 12 PO) Take 1,000 m by mouth.    [provider]  INTRAROSA 6.5 MG INST SMARTSIG:1 Insert Vaginal Every Night 11/22/19   [provider]  potassium chloride  (KLOR-CON ) 10 MEQ tablet Take 10 mEq by mouth daily. 02/25/20   [provider]  telmisartan-hydrochlorothiazide  (MICARDIS HCT) 80-25 MG tablet Take 1 tablet by mouth daily. 10/02/19   [provider]  traMADol  (ULTRAM ) 50 MG tablet Take 50-100 mg by mouth every 6 (six) hours as needed. 02/04/20   [provider]  vitamin E 1000 UNIT capsule Take 1,000 Units by mouth every morning.     [provider]      Allergies    Doxycycline, Hydrocodone, Oxycodone , and Shellfish allergy     Review of Systems   Review of  Systems  Constitutional:  Positive for chills.  Gastrointestinal:  Positive for abdominal pain and nausea.  All other systems reviewed and are negative.   Physical Exam Updated Vital Signs BP (!) 164/93   Pulse 72   Temp 98.4 F (36.9 C) (Oral)   Resp 18   Ht 5' 3 (1.6 m)   Wt 93.4 kg   SpO2 97%   BMI 36.48 kg/m  Physical Exam Vitals and nursing note reviewed.  Constitutional:      Appearance: Normal appearance.  HENT:     Head: Normocephalic and atraumatic.  Eyes:     Conjunctiva/sclera: Conjunctivae normal.  Cardiovascular:     Rate and Rhythm: Normal rate and regular rhythm.  Pulmonary:     Effort: Pulmonary effort is normal. No respiratory distress.     Breath sounds: Normal breath sounds.  Abdominal:     General: There is no distension.     Palpations: Abdomen is soft.     Tenderness: There is abdominal tenderness in the right upper quadrant. There is no guarding or rebound.  Skin:    General: Skin is warm and dry.  Neurological:     General: No focal deficit present.     Mental Status: She is alert.     ED Results / Procedures / Treatments   Labs (all labs ordered are listed, but only abnormal results are displayed) Labs Reviewed  URINALYSIS, ROUTINE W REFLEX MICROSCOPIC - Abnormal; Notable for the following components:      Result  Value   APPearance HAZY (*)    Bacteria, UA RARE (*)    All other components within normal limits  LIPASE, BLOOD  COMPREHENSIVE METABOLIC PANEL  CBC    EKG None  Radiology US  Abdomen Limited RUQ (LIVER/GB) Result Date: 06/30/2023 CLINICAL DATA:  Right upper quadrant pain and nausea. EXAM: ULTRASOUND ABDOMEN LIMITED RIGHT UPPER QUADRANT COMPARISON:  Ultrasound 2011.  CT 2016 October FINDINGS: Gallbladder: No gallstones or wall thickening visualized. No sonographic Murphy sign noted by sonographer. Common bile duct: Diameter: 2 mm Liver: No focal lesion identified. Within normal limits in parenchymal echogenicity. Portal  vein is patent on color Doppler imaging with normal direction of blood flow towards the liver. Other: None. IMPRESSION: No gallstones or ductal dilatation. Electronically Signed   By: Ranell Bring M.D.   On: 06/30/2023 16:36    Procedures Procedures    Medications Ordered in ED Medications  morphine  (PF) 4 MG/ML injection 4 mg (4 mg Intravenous Given 06/30/23 1608)  iohexol  (OMNIPAQUE ) 300 MG/ML solution 100 mL (100 mLs Intravenous Contrast Given 06/30/23 1813)    ED Course/ Medical Decision Making/ A&P                                 Medical Decision Making Amount and/or Complexity of Data Reviewed Labs: ordered. Radiology: ordered.  Risk Prescription drug management.  This patient is a 58 y.o. female  who presents to the ED for concern of RUQ abdominal pain x 1 week.   Differential diagnoses prior to evaluation: The emergent differential diagnosis includes, but is not limited to, Biliary colic, cholecystitis, hepatitis (viral, alcoholic, toxic), appendicitis, GERD/PUD, pancreatitis, malignancy, hepatic ischemia, right lower lobe pneumonia, pyelonephritis, urinary calculi, UTI, herpes zoster, musculoskeletal pain, herniated disk, intestinal ischemia, IBD, Fitz-Hugh-Curtis syndrome (with pelvic inflammatory disease), ectopic pregnancy, IUP, endometriosis, ovarian cyst . This is not an exhaustive differential.   Past Medical History / Co-morbidities / Social History: PCOS, chronic pelvic pain, pelvic adhesions, hypertension, anxiety, uterine fibroids  Additional history: Chart reviewed. Pertinent results include: Reviewed ER visit note from 2016 when patient presented with similar symptoms, was told that she was gassy, was discharged.  Physical Exam: Physical exam performed. The pertinent findings include: Hypertensive, otherwise normal vital signs.  No acute distress.  Right upper quadrant tenderness to palpation, otherwise abdomen soft and nontender.  No guarding or rebound.  Lab  Tests/Imaging studies: I personally interpreted labs/imaging and the pertinent results include: CBC and CMP unremarkable.  Normal lipase.  UA negative.  Right upper quadrant ultrasound unremarkable. I agree with the radiologist interpretation.  CT abdomen pelvis pending at time of shift change.  Medications: I ordered medication including morphine .  I have reviewed the patients home medicines and have made adjustments as needed.   Disposition: Patient discussed and care transferred to Sm[place] PA-C at shift change. Please see his/her note for further details regarding further ED course and disposition. Plan at time of handoff is follow up on CT imaging. Anticipate dc to home if normal.    Final Clinical Impression(s) / ED Diagnoses Final diagnoses:  RUQ pain    Rx / DC Orders ED Discharge Orders     None      Portions of this report may have been transcribed using voice recognition software. Every effort was made to ensure accuracy; however, inadvertent computerized transcription errors may be present.    Corie Friddie DASEN, PA-C 06/30/23 1851  Tegeler, Lonni PARAS, MD 06/30/23 2200

## 2023-06-30 NOTE — ED Triage Notes (Signed)
 Pt states RUQ pain x 1 week, states chills and nausea that started this am

## 2023-11-12 ENCOUNTER — Other Ambulatory Visit: Payer: Self-pay | Admitting: Internal Medicine

## 2023-11-12 ENCOUNTER — Encounter: Payer: Self-pay | Admitting: Internal Medicine

## 2023-11-12 DIAGNOSIS — R7989 Other specified abnormal findings of blood chemistry: Secondary | ICD-10-CM

## 2023-11-12 DIAGNOSIS — R600 Localized edema: Secondary | ICD-10-CM

## 2023-11-13 ENCOUNTER — Ambulatory Visit
Admission: RE | Admit: 2023-11-13 | Discharge: 2023-11-13 | Disposition: A | Discharge status: Home / Self Care | Source: Ambulatory Visit | Attending: Internal Medicine | Admitting: Internal Medicine

## 2023-11-13 DIAGNOSIS — R600 Localized edema: Secondary | ICD-10-CM

## 2023-11-13 DIAGNOSIS — R7989 Other specified abnormal findings of blood chemistry: Secondary | ICD-10-CM

## 2023-12-04 ENCOUNTER — Other Ambulatory Visit: Payer: Self-pay | Admitting: Internal Medicine

## 2023-12-04 ENCOUNTER — Ambulatory Visit
Admission: RE | Admit: 2023-12-04 | Discharge: 2023-12-04 | Disposition: A | Discharge status: Home / Self Care | Source: Ambulatory Visit | Attending: Internal Medicine | Admitting: Internal Medicine

## 2023-12-04 DIAGNOSIS — R058 Other specified cough: Secondary | ICD-10-CM

## 2023-12-19 NOTE — Progress Notes (Unsigned)
 New Patient Note  RE: [name] MRN: [id number] DOB: 20-Jul-1965 Date of Office Visit: 12/20/2023  Consult requested by: Valma Carwin, MD Primary care provider: Valma Carwin, MD  Chief Complaint: No chief complaint on file.  History of Present Illness: I had the pleasure of seeing [name] for initial evaluation at the [facility] on 12/20/2023. She is a 58 y.o. female, who is referred here by Valma Carwin, MD for the evaluation of ***.  Discussed the use of AI scribe software for clinical note transcription with the patient, who gave verbal consent to proceed.  History of Present Illness             ***  Assessment and Plan: [name] is a 58 y.o. female with: ***  Assessment and Plan               No follow-ups on file.  No orders of the defined types were placed in this encounter.  Lab Orders  No laboratory test(s) ordered today    Other allergy screening: Asthma: {Blank single:19197::yes,no} Rhino conjunctivitis: {Blank single:19197::yes,no} Food allergy: {Blank single:19197::yes,no} Medication allergy: {Blank single:19197::yes,no} Hymenoptera allergy: {Blank single:19197::yes,no} Urticaria: {Blank single:19197::yes,no} Eczema:{Blank single:19197::yes,no} History of recurrent infections suggestive of immunodeficency: {Blank single:19197::yes,no}  Diagnostics: Spirometry:  Tracings reviewed. Her effort: {Blank single:19197::Good reproducible efforts.,It was hard to get consistent efforts and there is a question as to whether this reflects a maximal maneuver.,Poor effort, data can not be interpreted.} FVC: ***L FEV1: ***L, ***% predicted FEV1/FVC ratio: ***% Interpretation: {Blank single:19197::Spirometry consistent with mild obstructive disease,Spirometry consistent with moderate obstructive disease,Spirometry consistent with severe obstructive disease,Spirometry consistent with  possible restrictive disease,Spirometry consistent with mixed obstructive and restrictive disease,Spirometry uninterpretable due to technique,Spirometry consistent with normal pattern,No overt abnormalities noted given today's efforts}.  Please see scanned spirometry results for details.  Skin Testing: {Blank single:19197::Select foods,Environmental allergy panel,Environmental allergy panel and select foods,Food allergy panel,None,Deferred due to recent antihistamines use}. *** Results discussed with patient/family.   Past Medical History: Patient Active Problem List   Diagnosis Date Noted  . Adnexal mass 10/28/2019  . 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 Asthma, chronic 01/14/2015  . Ovarian cyst 01/12/2015  . Pelvic pain in female 04/01/2012  . PID (pelvic inflammatory disease)   . ASCUS (atypical squamous cells of undetermined significance) on Pap smear   . Infertility associated with anovulation   . Fibroids   . HSV-2 infection   . DVT (deep venous thrombosis) (HCC)   . Hypertension 10/12/2011  . Encounter for routine gynecological examination 10/12/2011  . Headache disorder 10/12/2011  . Frontal sinus pain 10/12/2011   Past Medical History:  Diagnosis Date  . Anxiety   . Chronic female pelvic pain   . Complication of anesthesia    HARD TO WAKE  . Cyst of right ovary   . High-tone pelvic floor dysfunction   . History of DVT (deep vein thrombosis)    left lower leg -- post op 02/ 2003 breast reduction surgery  . History of Helicobacter pylori infection   . History of ovarian cyst   . History of PID 1994 OR 1995  . History of uterine fibroid   . Hypertension   . Lichen simplex chronicus    via left perianal bx/ buttocks  . PCOS (polycystic ovarian syndrome)   . Pelvic adhesions    hx extensive s/p  lysis adhesions -- total x4  . Seasonal asthma   . VIN III (vulvar intraepithelial  neoplasia III)    Past Surgical  History: Past Surgical History:  Procedure Laterality Date  . ABDOMINAL HYSTERECTOMY  09/14/2005   w/  Bilateral Salpingectomy  . BREAST REDUCTION SURGERY Bilateral 07/29/2001  . CARPAL TUNNEL RELEASE Bilateral right 2009;   left 2010  . D & C HYSTEROSCOPY W/ RESECTION UTERINE FIBROIDS  02/02/2005  . DX LAPAROSCOPY W/ LYSIS ADHESIONS  1994;  1995;  1996  . LAPAROSCOPIC SALPINGO OOPHERECTOMY Left 01/12/2015   Procedure: LAPAROSCOPY;  Surgeon: Shanda SHAUNNA Muscat, MD;  Location: [facility];  Service: Gynecology;  Laterality: Left;  . LAPAROTOMY N/A 01/12/2015   Procedure: LAPAROTOMY;  Surgeon: Shanda SHAUNNA Muscat, MD;  Location: [facility];  Service: Gynecology;  Laterality: N/A;  . OOPHORECTOMY Left 01/12/2015   Procedure: OOPHORECTOMY;  Surgeon: Shanda SHAUNNA Muscat, MD;  Location: [facility];  Service: Gynecology;  Laterality: Left;  . REPAIR TENDONS RIGHT WRIST W/ REVISION CARPAL TUNNEL RELEASE  2015  . VULVECTOMY N/A 09/26/2016   Procedure: WIDE LOCAL EXCISION OF VULVA;  Surgeon: Maurilio Ship, MD;  Location: [facility];  Service: Gynecology;  Laterality: N/A;  . WISDOM TOOTH EXTRACTION     Medication List:  Current Outpatient Medications  Medication Sig Dispense Refill  . albuterol  (PROVENTIL  HFA;VENTOLIN  HFA) 108 (90 BASE) MCG/ACT inhaler Inhale 2 puffs into the lungs every 6 (six) hours as needed for wheezing or shortness of breath.    . Cyanocobalamin (VITAMIN B 12 PO) Take 1,000 m by mouth.    . INTRAROSA 6.5 MG INST SMARTSIG:1 Insert Vaginal Every Night    . potassium chloride  (KLOR-CON ) 10 MEQ tablet Take 10 mEq by mouth daily.    SABRA telmisartan-hydrochlorothiazide  (MICARDIS HCT) 80-25 MG tablet Take 1 tablet by mouth daily.    . traMADol  (ULTRAM ) 50 MG tablet Take 50-100 mg by mouth every 6 (six) hours as needed.    . vitamin E 1000 UNIT capsule Take 1,000 Units by mouth every morning.      No current facility-administered medications for this visit.   Allergies: Allergies  Allergen  Reactions  . Doxycycline Nausea And Vomiting  . Hydrocodone Itching  . Oxycodone  Itching    percocet  . Shellfish Allergy Rash   Social History: Social History   Socioeconomic History  . Marital status: Married    Spouse name: Not on file  . Number of children: Not on file  . Years of education: Not on file  . Highest education level: Not on file  Occupational History  . Not on file  Tobacco Use  . Smoking status: Never  . Smokeless tobacco: Never  Vaping Use  . Vaping status: Never Used  Substance and Sexual Activity  . Alcohol use: No  . Drug use: No  . Sexual activity: Yes    Partners: Male    Birth control/protection: Surgical    Comment: HYST   Other Topics Concern  . Not on file  Social History Narrative  . Not on file   Social Drivers of Health   Financial Resource Strain: Not on file  Food Insecurity: Not on file  Transportation Needs: Not on file  Physical Activity: Not on file  Stress: Not on file  Social Connections: Unknown (11/04/2021)   Received from [facility]   Social Network   . Social Network: Not on file   Lives in a ***. Smoking: *** Occupation: ***  Environmental History: Water Damage/mildew in the house: Network engineer in the family room: {Blank single:19197::yes,no} Engineer, civil (consulting) in  the bedroom: {Blank single:19197::yes,no} Heating: {Blank single:19197::electric,gas,heat pump} Cooling: {Blank single:19197::central,window,heat pump} Pet: {Blank single:19197::yes ***,no}  Family History: Family History  Problem Relation Age of Onset  . Cancer Paternal Grandmother 38       OVARIAN  . Diabetes Maternal Aunt   . Cancer Paternal Aunt 30       CERVICAL  . Stroke Maternal Grandmother   . Hypertension Mother   . Cancer Mother        breast  . Hypertension Father   . Heart attack Father    Problem                               Relation Asthma                                    *** Eczema                                *** Food allergy                          *** Allergic rhino conjunctivitis     ***  Review of Systems  Constitutional:  Negative for appetite change, chills, fever and unexpected weight change.  HENT:  Negative for congestion and rhinorrhea.   Eyes:  Negative for itching.  Respiratory:  Negative for cough, chest tightness, shortness of breath and wheezing.   Cardiovascular:  Negative for chest pain.  Gastrointestinal:  Negative for abdominal pain.  Genitourinary:  Negative for difficulty urinating.  Skin:  Negative for rash.  Neurological:  Negative for headaches.   Objective: There were no vitals taken for this visit. There is no height or weight on file to calculate BMI. Physical Exam Vitals and nursing note reviewed.  Constitutional:      Appearance: Normal appearance. She is well-developed.  HENT:     Head: Normocephalic and atraumatic.     Right Ear: Tympanic membrane and external ear normal.     Left Ear: Tympanic membrane and external ear normal.     Nose: Nose normal.     Mouth/Throat:     Mouth: Mucous membranes are moist.     Pharynx: Oropharynx is clear.   Eyes:     Conjunctiva/sclera: Conjunctivae normal.    Cardiovascular:     Rate and Rhythm: Normal rate and regular rhythm.     Heart sounds: Normal heart sounds. No murmur heard.    No friction rub. No gallop.  Pulmonary:     Effort: Pulmonary effort is normal.     Breath sounds: Normal breath sounds. No wheezing, rhonchi or rales.   Musculoskeletal:     Cervical back: Neck supple.   Skin:    General: Skin is warm.     Findings: No rash.   Neurological:     Mental Status: She is alert and oriented to person, place, and time.   Psychiatric:        Behavior: Behavior normal.  The plan was reviewed with the patient/family, and all questions/concerned were addressed.  It was my pleasure to see Ambermarie today and participate in her care. Please feel free to  contact me with any questions or concerns.  Sincerely,  Orlan Cramp, DO Allergy & Immunology  [facility]  [place] office: [phone] [facility] office: [phone]

## 2023-12-20 ENCOUNTER — Ambulatory Visit: Payer: Self-pay | Admitting: Allergy

## 2023-12-20 ENCOUNTER — Encounter: Payer: Self-pay | Admitting: Allergy

## 2023-12-20 ENCOUNTER — Other Ambulatory Visit: Payer: Self-pay

## 2023-12-20 ENCOUNTER — Encounter: Payer: Self-pay | Admitting: Gynecologic Oncology

## 2023-12-20 VITALS — BP 130/78 | HR 75 | Temp 98.2°F | Resp 18 | Ht 62.5 in | Wt 219.0 lb

## 2023-12-20 DIAGNOSIS — J3089 Other allergic rhinitis: Secondary | ICD-10-CM

## 2023-12-20 DIAGNOSIS — T781XXD Other adverse food reactions, not elsewhere classified, subsequent encounter: Secondary | ICD-10-CM

## 2023-12-20 DIAGNOSIS — J4540 Moderate persistent asthma, uncomplicated: Secondary | ICD-10-CM

## 2023-12-20 DIAGNOSIS — R12 Heartburn: Secondary | ICD-10-CM

## 2023-12-20 MED ORDER — BUDESONIDE-FORMOTEROL FUMARATE 80-4.5 MCG/ACT IN AERO: 2.0000 | INHALATION_SPRAY | Freq: Two times a day (BID) | RESPIRATORY_TRACT | 3 refills | Status: DC

## 2023-12-20 MED ORDER — EPINEPHRINE 0.3 MG/0.3ML IJ SOAJ
0.3000 mg | INTRAMUSCULAR | 1 refills | Status: AC | PRN
Start: 2023-12-20 — End: ?

## 2023-12-20 NOTE — Patient Instructions (Addendum)
 Coughing Daily controller medication(s): start Symbicort 80mcg 2 puffs twice a day with spacer and rinse mouth afterwards. Spacer given and demonstrated proper use with inhaler. Patient understood technique and all questions/concerned were addressed.  May use Airsupra rescue inhaler 2 puffs every 4 to 6 hours as needed for shortness of breath, chest tightness, coughing, and wheezing. Do not use more than 12 puffs in 24 hours. May use Airsupra rescue inhaler 2 puffs 5 to 15 minutes prior to strenuous physical activities. Rinse mouth after each use.  Monitor frequency of use - if you need to use it more than twice per week on a consistent basis let us  know.  Breathing control goals:  Full participation in all desired activities (may need albuterol  before activity) Albuterol  use two times or less a week on average (not counting use with activity) Cough interfering with sleep two times or less a month Oral steroids no more than once a year No hospitalizations   Food allergies Continue strict avoidance of seafood. I have prescribed epinephrine injectable device and demonstrated proper use. For mild symptoms you can take over the counter antihistamines (zyrtec 10mg  to 20mg ) and monitor symptoms closely.  If symptoms worsen or if you have severe symptoms including breathing issues, throat closure, significant swelling, whole body hives, severe diarrhea and vomiting, lightheadedness then use epinephrine and seek immediate medical care afterwards. Emergency action plan given.  Rhinitis  Return for allergy skin testing. Will make additional recommendations based on results. Make sure you don't take any antihistamines for 3 days before the skin testing appointment. Don't put any lotion on the back and arms on the day of testing.  Must be in good health and not ill. No vaccines/injections/antibiotics within the past 7 days.  Plan on being here for 30-60 minutes.  Heartburn See handout for lifestyle  and dietary modifications.  Follow up for skin testing.

## 2023-12-24 ENCOUNTER — Telehealth: Payer: Self-pay | Admitting: Allergy

## 2023-12-24 NOTE — Telephone Encounter (Signed)
 Called and spoke to patient and she expressed that the pain was under left breast. States that she is coughing a lot and expressed that Dr. Luke explained that it could be from coughing so much that her lungs could be inflamed. Patient expressed that she is taking Tylenol  however it isn't helping. Patient informed of Dr. Mariella note and expressed that she will reach out to her PCP as well.

## 2023-12-24 NOTE — Telephone Encounter (Signed)
 PT called advising feeling pain and that Tylenol  isn't enough, wants to know if Dr. Luke could prescribe RX for pain management. I offered OV, PT declined, so advised would send note to provider, she thanked

## 2023-12-24 NOTE — Telephone Encounter (Signed)
 Please call patient.  I don't give medications for pain as that is not my realm of specialty and I don't prescribe pain medications.   What kind of pain is she having? She should go see her PCP regarding pain.  Thank you.

## 2024-01-10 ENCOUNTER — Ambulatory Visit: Admitting: Pulmonary Disease

## 2024-01-15 NOTE — Progress Notes (Unsigned)
 Skin testing note  RE: [name] MRN: [id number] DOB: June 29, 1965 Date of Office Visit: 01/16/2024  Referring provider: Valma Carwin, MD Primary care provider: Valma Carwin, MD  Chief Complaint: skin testing  History of Present Illness: I had the pleasure of seeing [name] for a skin testing visit at the [facility] on 01/16/2024. She is a 58 y.o. female, who is being followed for allergic rhinitis, reactive airway disease, adverse food reaction and heartburn. Her previous allergy  office visit was on 12/20/2023 with Dr. Luke. Today is a skin testing visit.   Discussed the use of AI scribe software for clinical note transcription with the patient, who gave verbal consent to proceed.    She is currently taking Allegra once daily for her allergies, as Zyrtec previously caused significant drowsiness that impaired her ability to function. Allegra is effective and does not cause sleepiness. She stopped taking Allegra on July 19th prior to today's visit.  She uses Symbicort  inhaler, taking two puffs twice a day, which is helping her breathing, although she did not use it on the morning of the visit. She notes improvement but feels it is too early to fully assess its effectiveness.     Assessment and Plan: [name] is a 58 y.o. female with: Other allergic rhinitis Past history - Concern for some PND and environmental triggers for the below issues.  Interim history - Switched to allegra as zyrtec caused drowsiness. Today's skin prick testing negative environmental and seafood panel but positive control was negative as well. Patient confirms not taking any antihistamines. Inconclusive test. Get bloodwork and will make additional recommendations based on results.  May take allegra daily as needed.  Other adverse food reactions, not elsewhere classified, subsequent encounter Past history - shellfish allergy  with throat swelling, lip itching, and rash. No previous allergy   testing or Epipen  prescription. Today's skin prick testing negative to seafood panel but positive control was negative as well. Patient confirms not taking any antihistamines. Inconclusive test. Get bloodwork and will make additional recommendations based on results.  Continue strict avoidance of seafood. Get bloodwork. If negative will discuss in office food challenges next.  For mild symptoms you can take over the counter antihistamines (zyrtec 10mg  to 20mg ) and monitor symptoms closely.  If symptoms worsen or if you have severe symptoms including breathing issues, throat closure, significant swelling, whole body hives, severe diarrhea and vomiting, lightheadedness then use epinephrine  and seek immediate medical care afterwards. Emergency action plan in place.   Moderate persistent reactive airway disease without complication Past history - inhaler use and most recently had chronic cough and unsure if inhalers are helping. Normal CXR on 6/10/205. 2025 spirometry showed normal pattern with 3% and 60cc improvement in FEV1 post bronchodilator treatment. Clinically feeling improved.  Daily controller medication(s): Symbicort  80mcg 2 puffs twice a day with spacer and rinse mouth afterwards. May use Airsupra rescue inhaler 2 puffs every 4 to 6 hours as needed for shortness of breath, chest tightness, coughing, and wheezing. Do not use more than 12 puffs in 24 hours. May use Airsupra rescue inhaler 2 puffs 5 to 15 minutes prior to strenuous physical activities. Rinse mouth after each use.  Monitor frequency of use - if you need to use it more than twice per week on a consistent basis let us  know.    Heartburn Past history - Discussed that not all coughing is from the lungs and given past history there may be some heartburn component. Continue  lifestyle and dietary modifications.  Return in about 2 months (around 03/18/2024).  No orders of the defined types were placed in this encounter.  Lab Orders          Allergen Profile, Shellfish         Allergen Profile, Food-Fish         Allergens w/Total IgE Area 2         Tryptase      Diagnostics: Skin Testing: Environmental allergy  panel and select foods. Today's skin testing negative environmental and seafood panel but positive control was negative as well. Inconclusive test. Confirmed that patient did not take antihistamines for 3 days prior. Results discussed with patient/family.  Airborne Adult Perc - 01/16/24 1010     Time Antigen Placed 1012    Allergen Manufacturer Jestine    Location Back    Number of Test 55    1. Control-Buffer 50% Glycerol Negative    2. Control-Histamine Negative    3. Bahia Negative    4. French Southern Territories Negative    5. Johnson Negative    6. Kentucky  Blue Negative    7. Meadow Fescue Negative    8. Perennial Rye Negative    9. Timothy Negative    10. Ragweed Mix Negative    11. Cocklebur Negative    12. Plantain,  English Negative    13. Baccharis Negative    14. Dog Fennel Negative    15. Russian Thistle Negative    16. Lamb's Quarters Negative    17. Sheep Sorrell Negative    18. Rough Pigweed Negative    19. Marsh Elder, Rough Negative    20. Mugwort, Common Negative    21. Box, Elder Negative    22. Cedar, red Negative    23. Sweet Gum Negative    24. Pecan Pollen Negative    25. Pine Mix Negative    26. Walnut, Black Pollen Negative    27. Red Mulberry Negative    28. Ash Mix Negative    29. Birch Mix Negative    30. Beech American Negative    31. Cottonwood, Guinea-Bissau Negative    32. Hickory, White Negative    33. Maple Mix Negative    34. Oak, Guinea[place]issau Mix Negative    35. Sycamore Eastern Negative    36. Alternaria Alternata Negative    37. Cladosporium Herbarum Negative    38. Aspergillus Mix Negative    39. Penicillium Mix Negative    40. Bipolaris Sorokiniana (Helminthosporium) Negative    41. Drechslera Spicifera (Curvularia) Negative    42. Mucor Plumbeus Negative    43. Fusarium  Moniliforme Negative    44. Aureobasidium Pullulans (pullulara) Negative    45. Rhizopus Oryzae Negative    46. Botrytis Cinera Negative    47. Epicoccum Nigrum Negative    48. Phoma Betae Negative    49. Dust Mite Mix Negative    50. Cat Hair 10,000 BAU/ml Negative    51.  Dog Epithelia Negative    52. Mixed Feathers Negative    53. Horse Epithelia Negative    54. Cockroach, German Negative    55. Tobacco Leaf Negative          Food Adult Perc - 01/16/24 1000     Time Antigen Placed 1014    Allergen Manufacturer Jestine    Location Back     Control-buffer 50% Glycerol Negative    Control-Histamine Negative    8. Shellfish Mix Negative    9.  Fish Mix Negative    18. Trout Negative    19. Tuna Negative    20. Salmon Negative    21. Flounder Negative    22. Codfish Negative    23. Shrimp Negative    24. Crab Negative    25. Lobster Negative    26. Oyster Negative    27. Scallops Negative          Previous notes and tests were reviewed. The plan was reviewed with the patient/family, and all questions/concerned were addressed.  It was my pleasure to see [name] today and participate in her care. Please feel free to contact me with any questions or concerns.  Sincerely,  Orlan Cramp, DO Allergy  & Immunology  [facility]  [place] office: [phone] [facility] office: [phone]

## 2024-01-16 ENCOUNTER — Ambulatory Visit (INDEPENDENT_AMBULATORY_CARE_PROVIDER_SITE_OTHER): Admitting: Allergy

## 2024-01-16 ENCOUNTER — Encounter: Payer: Self-pay | Admitting: Allergy

## 2024-01-16 DIAGNOSIS — J3089 Other allergic rhinitis: Secondary | ICD-10-CM

## 2024-01-16 DIAGNOSIS — R12 Heartburn: Secondary | ICD-10-CM

## 2024-01-16 DIAGNOSIS — J4540 Moderate persistent asthma, uncomplicated: Secondary | ICD-10-CM

## 2024-01-16 DIAGNOSIS — T781XXD Other adverse food reactions, not elsewhere classified, subsequent encounter: Secondary | ICD-10-CM | POA: Diagnosis not present

## 2024-01-16 NOTE — Patient Instructions (Addendum)
 Today's skin testing negative environmental and seafood panel but positive control was negative as well. Inconclusive test.  Will get bloodwork next. We are ordering labs, so please allow 1-2 weeks for the results to come back. With the newly implemented Cures Act, the labs might be visible to you at the same time that they become visible to me. However, I will not address the results until all of the results are back, so please be patient.  In the meantime, continue recommendations in your patient instructions, including avoidance measures (if applicable), until you hear from me. Rhinitis May take allegra daily as needed.  Coughing Daily controller medication(s): Symbicort  80mcg 2 puffs twice a day with spacer and rinse mouth afterwards. May use Airsupra rescue inhaler 2 puffs every 4 to 6 hours as needed for shortness of breath, chest tightness, coughing, and wheezing. Do not use more than 12 puffs in 24 hours. May use Airsupra rescue inhaler 2 puffs 5 to 15 minutes prior to strenuous physical activities. Rinse mouth after each use.  Monitor frequency of use - if you need to use it more than twice per week on a consistent basis let us  know.  Breathing control goals:  Full participation in all desired activities (may need albuterol  before activity) Albuterol  use two times or less a week on average (not counting use with activity) Cough interfering with sleep two times or less a month Oral steroids no more than once a year No hospitalizations   Food allergies Continue strict avoidance of seafood. Get bloodwork. If negative will discuss in office food challenges next.  For mild symptoms you can take over the counter antihistamines (zyrtec 10mg  to 20mg ) and monitor symptoms closely.  If symptoms worsen or if you have severe symptoms including breathing issues, throat closure, significant swelling, whole body hives, severe diarrhea and vomiting, lightheadedness then use epinephrine  and seek  immediate medical care afterwards. Emergency action plan in place.   Heartburn Continue lifestyle and dietary modifications.  Follow up in 2 months or sooner if needed.

## 2024-01-18 ENCOUNTER — Ambulatory Visit: Payer: Self-pay | Admitting: Allergy

## 2024-01-18 LAB — ALLERGEN PROFILE, FOOD-FISH
Allergen Mackerel IgE: <0.1 kU/L
Allergen Salmon IgE: <0.1 kU/L
Allergen Trout IgE: <0.1 kU/L
Allergen Walley Pike IgE: <0.1 kU/L
Codfish IgE: <0.1 kU/L
Halibut IgE: <0.1 kU/L
Tuna: <0.1 kU/L

## 2024-01-18 LAB — ALLERGENS W/TOTAL IGE AREA 2

## 2024-01-18 LAB — ALLERGEN PROFILE, SHELLFISH
Clam IgE: <0.1 kU/L
F023-IgE Crab: <0.1 kU/L
F080-IgE Lobster: <0.1 kU/L
F290-IgE Oyster: <0.1 kU/L
Scallop IgE: <0.1 kU/L
Shrimp IgE: <0.1 kU/L

## 2024-01-18 LAB — TRYPTASE: Tryptase: 5.5 ug/L (ref 2.2–13.2)

## 2024-02-29 ENCOUNTER — Other Ambulatory Visit: Payer: Self-pay | Admitting: *Deleted

## 2024-02-29 DIAGNOSIS — R600 Localized edema: Secondary | ICD-10-CM

## 2024-03-13 ENCOUNTER — Ambulatory Visit (HOSPITAL_COMMUNITY)

## 2024-03-18 ENCOUNTER — Ambulatory Visit: Admitting: Podiatry

## 2024-03-19 ENCOUNTER — Ambulatory Visit: Admitting: Allergy

## 2024-03-27 ENCOUNTER — Ambulatory Visit

## 2024-03-28 ENCOUNTER — Ambulatory Visit (HOSPITAL_COMMUNITY)

## 2024-04-10 NOTE — Progress Notes (Signed)
 New Patient Pulmonology Office Visit   Subjective:  Patient ID: [name], female    DOB: 1966/02/27  MRN: [id number]  Referred by: Valma Carwin, MD  CC: No chief complaint on file.  HPI  [name] is a 58 y.o. female with history of asthma, who takes Symbicort  as needed, typically during springtime yearly.  She denies any asthma exacerbation hospital admissions.  She reports this year she started having more frequent cough since May.  She went to the allergist and her cough has improved after taking Symbicort  as needed.  She has started having more cough over the last month and she has been using his Symbicort  once in the morning.  The cough is mainly dry and her wheezing has been persistent throughout the day.  Her last dose of prednisone was in May.  She has postnasal drip and uses Flonase.  She denies any sinus surgery, acid reflux, and denied any environmental trigger.  She cleans her house every day.  She has a dog.  Denied dusty environment, mold, water leakage.  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  She carries an EpiPen  with her for emergencies.  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 with spacer and rinse mouth afterwards., Disp: 1 each, Rfl: 3   Cyanocobalamin (VITAMIN B 12 PO), Take 1,000 m by mouth., Disp: , Rfl:    EPINEPHrine  0.3 mg/0.3 mL IJ SOAJ injection, Inject 0.3 mg into the muscle as needed for anaphylaxis.,  Disp: 2 each, Rfl: 1   INTRAROSA 6.5 MG INST, SMARTSIG:1 Insert Vaginal Every Night, Disp: , Rfl:    potassium chloride  (KLOR-CON ) 10 MEQ tablet, Take 10 mEq by mouth daily., Disp: , Rfl:    telmisartan-hydrochlorothiazide  (MICARDIS HCT) 80-25 MG tablet, Take 1 tablet by mouth daily., Disp: , Rfl:    traMADol  (ULTRAM ) 50 MG tablet, Take 50-100 mg by mouth every 6 (six) hours as needed., Disp: , Rfl:    vitamin E 1000 UNIT capsule, Take 1,000 Units by mouth every morning. , Disp: , Rfl:  Past Medical History:  Diagnosis Date   Anxiety    Chronic female pelvic pain    Complication of anesthesia    HARD TO WAKE   Cyst of right ovary    High-tone pelvic floor dysfunction    History of DVT (deep vein thrombosis)    left lower leg -- post op 02/ 2003 breast reduction surgery   History of Helicobacter pylori infection    History of ovarian cyst    History of PID 1994 OR 1995   History of uterine fibroid    Hypertension    Lichen simplex chronicus    via left perianal bx/ buttocks   PCOS (polycystic ovarian syndrome)    Pelvic adhesions    hx extensive s/p  lysis adhesions -- total x4   Seasonal asthma    VIN III (vulvar intraepithelial neoplasia III)    Past Surgical History:  Procedure Laterality Date  ABDOMINAL HYSTERECTOMY  09/14/2005   w/  Bilateral Salpingectomy   BREAST REDUCTION SURGERY Bilateral 07/29/2001   CARPAL TUNNEL RELEASE Bilateral right 2009;   left 2010   D & C HYSTEROSCOPY W/ RESECTION UTERINE FIBROIDS  02/02/2005   DX LAPAROSCOPY W/ LYSIS ADHESIONS  1994;  1995;  1996   LAPAROSCOPIC SALPINGO OOPHERECTOMY Left 01/12/2015   Procedure: LAPAROSCOPY;  Surgeon: Shanda SHAUNNA Muscat, MD;  Location: [facility];  Service: Gynecology;  Laterality: Left;   LAPAROTOMY N/A 01/12/2015   Procedure: LAPAROTOMY;  Surgeon: Shanda SHAUNNA Muscat, MD;  Location: [facility];  Service: Gynecology;  Laterality: N/A;   OOPHORECTOMY Left 01/12/2015   Procedure: OOPHORECTOMY;  Surgeon: Shanda SHAUNNA Muscat,  MD;  Location: [facility];  Service: Gynecology;  Laterality: Left;   REPAIR TENDONS RIGHT WRIST W/ REVISION CARPAL TUNNEL RELEASE  2015   VULVECTOMY N/A 09/26/2016   Procedure: WIDE LOCAL EXCISION OF VULVA;  Surgeon: Maurilio Ship, MD;  Location: [facility];  Service: Gynecology;  Laterality: N/A;   WISDOM TOOTH EXTRACTION     Family History  Problem Relation Age of Onset   Cancer Paternal Grandmother 54       OVARIAN   Diabetes Maternal Aunt    Cancer Paternal Aunt 30       CERVICAL   Stroke Maternal Grandmother    Hypertension Mother    Cancer Mother        breast   Hypertension Father    Heart attack Father    Social History   Socioeconomic History   Marital status: Married    Spouse name: Not on file   Number of children: Not on file   Years of education: Not on file   Highest education level: Not on file  Occupational History   Not on file  Tobacco Use   Smoking status: Never   Smokeless tobacco: Never  Vaping Use   Vaping status: Never Used  Substance and Sexual Activity   Alcohol use: No   Drug use: No   Sexual activity: Yes    Partners: Male    Birth control/protection: Surgical    Comment: HYST   Other Topics Concern   Not on file  Social History Narrative   Not on file   Social Drivers of Health   Financial Resource Strain: Not on file  Food Insecurity: Not on file  Transportation Needs: Not on file  Physical Activity: Not on file  Stress: Not on file  Social Connections: Unknown (11/04/2021)   [facility]   Social Network    Social Network: Not on file  Intimate Partner Violence: Unknown (09/26/2021)   Received from [facility]   HITS    Physically Hurt: Not on file    Insult or Talk Down To: Not on file    Threaten Physical Harm: Not on file    Scream or Curse: Not on file       Objective:  There were no vitals taken for this visit. Wt Readings from Last 3 Encounters:  04/11/24 214 lb 6.4 oz (97.3 kg)  12/20/23  219 lb (99.3 kg)  06/30/23 205 lb 14.6 oz (93.4 kg)   SpO2 Readings from Last 3 Encounters:  04/11/24 99%  12/20/23 97%  06/30/23 98%    Physical Exam Constitutional:      Appearance: Normal appearance.  HENT:     Head: Normocephalic.  Eyes:     Pupils: Pupils are equal, round, and reactive to light.  Cardiovascular:     Rate and Rhythm: Normal rate and regular rhythm.  Pulmonary:     Effort: Pulmonary effort is normal.     Breath sounds: Normal breath sounds.  Musculoskeletal:        General: Normal range of motion.  Neurological:     General: No focal deficit present.     Mental Status: She is alert and oriented to person, place, and time. Mental status is at baseline.     Diagnostic Review:  Last CBC Lab Results  Component Value Date   WBC 5.5 06/30/2023   HGB 14.4 06/30/2023   HCT 44.0 06/30/2023   MCV 92.1 06/30/2023   MCH 30.1 06/30/2023   RDW 13.2 06/30/2023   PLT 307 06/30/2023   12/20/23 FVC: 2.20L, 82% FEV1: 1.89L, 88% predicted FEV1/FVC ratio: 86% Interpretation: Spirometry consistent with normal pattern with 3% and 60cc improvement in FEV1 post bronchodilator treatment. Clinically feeling improved.   CXR 11/2023: normal    Assessment & Plan:   Assessment & Plan Mild intermittent chronic asthma without complication  Chronic cough  58 years old with history of asthma using Symbicort  as needed typically on b spring time.  Came with persistent cough, wheezing over the last month.  She does not seem to be in acute exacerbation today. I think she will need to have Symbicort  scheduled daily to better control of her asthma and symptoms.  I reinforced inhaler technique today.  Plan: Continue Symbicort  2 puff twice daily with a spacer  Albuterol  as need it I dont think she needs prednisone at this time. RTC in 3 months.  Anaphylactic shock due to shellfish, subsequent encounter Carries Epipen  and she is aware how to use it. She has angioedema,  bronchospasm, hives.      Return in about 3 months (around 07/12/2024).    Marny Patch, MD Pulmonary and Critical Care Medicine [facility]

## 2024-04-11 ENCOUNTER — Ambulatory Visit (HOSPITAL_COMMUNITY)
Admission: RE | Admit: 2024-04-11 | Discharge: 2024-04-11 | Disposition: A | Discharge status: Home / Self Care | Source: Ambulatory Visit | Attending: Physician Assistant | Admitting: Physician Assistant

## 2024-04-11 ENCOUNTER — Ambulatory Visit

## 2024-04-11 VITALS — BP 164/88 | HR 72 | Ht 62.75 in | Wt 214.4 lb

## 2024-04-11 DIAGNOSIS — Z91013 Allergy to seafood: Secondary | ICD-10-CM

## 2024-04-11 DIAGNOSIS — J4520 Mild intermittent asthma, uncomplicated: Secondary | ICD-10-CM | POA: Diagnosis not present

## 2024-04-11 DIAGNOSIS — R600 Localized edema: Secondary | ICD-10-CM | POA: Insufficient documentation

## 2024-04-11 DIAGNOSIS — T7802XD Anaphylactic reaction due to shellfish (crustaceans), subsequent encounter: Secondary | ICD-10-CM

## 2024-04-11 DIAGNOSIS — R053 Chronic cough: Secondary | ICD-10-CM

## 2024-04-11 MED ORDER — BUDESONIDE-FORMOTEROL FUMARATE 80-4.5 MCG/ACT IN AERO: 2.0000 | INHALATION_SPRAY | Freq: Two times a day (BID) | RESPIRATORY_TRACT | 6 refills | Status: AC

## 2024-04-11 MED ORDER — BENZONATATE 200 MG PO CAPS: 200.0000 mg | ORAL_CAPSULE | Freq: Three times a day (TID) | ORAL | 1 refills | Status: AC | PRN

## 2024-04-11 MED ORDER — ALBUTEROL SULFATE HFA 108 (90 BASE) MCG/ACT IN AERS: 2.0000 | INHALATION_SPRAY | Freq: Four times a day (QID) | RESPIRATORY_TRACT | 6 refills | Status: AC | PRN

## 2024-04-11 NOTE — Patient Instructions (Addendum)
 Dear [name], I think we need to have your asthma under control: -Take Symbicort  every day twice a day. -Use the albuterol  inhaler as need it for wheezing or severe cough. -Tessalon pearls 3 times a day for cough -If you feel symptoms are getting worst, please let me know through my chart.  I will see you in 3 months.

## 2024-04-16 NOTE — Progress Notes (Unsigned)
 VASCULAR [facility]IN [facility]ISTS           OF [place]  History and Physical   JULIAHNA WISWELL is a 58 y.o. female who presents with pedal edema.   Pt states that she has had some swelling in her feet bilaterally and it was equal.  She did get some ankle compression socks and her swelling has improved.  She does not have any skin color changes.  She works as a Midwife at [employer].  She states that she had a DVT in the left leg in 2004 after breast reduction surgery and was on BCP at the time.  She states that she fell on her left leg and feels that is when it happened.  She has not had any DVT since then.  Her mother had hx of varicose veins/leg swelling.  She passed away last 2023/12/14.    She did have a DVT study in 06-20-2025that was negative for DVT.    She has hx of asthma on inhalers.  The pt is on a statin for cholesterol management.  The pt is not on a daily aspirin.   Other AC:  none The pt is on ARB, diuretic for hypertension.   The pt is not on medication for diabetes.   Tobacco hx:  never  Pt does not have family hx of AAA.  Past Medical History:  Diagnosis Date   Anxiety    Chronic female pelvic pain    Complication of anesthesia    HARD TO WAKE   Cyst of right ovary    High-tone pelvic floor dysfunction    History of DVT (deep vein thrombosis)    left lower leg -- post op 02/ 2003 breast reduction surgery   History of Helicobacter pylori infection    History of ovarian cyst    History of PID 1994 OR 1995   History of uterine fibroid    Hypertension    Lichen simplex chronicus    via left perianal bx/ buttocks   PCOS (polycystic ovarian syndrome)    Pelvic adhesions    hx extensive s/p  lysis adhesions -- total x4   Seasonal asthma    VIN III (vulvar intraepithelial neoplasia III)     Past Surgical History:  Procedure Laterality Date   ABDOMINAL HYSTERECTOMY  09/14/2005   w/  Bilateral Salpingectomy   BREAST REDUCTION SURGERY  Bilateral 07/29/2001   CARPAL TUNNEL RELEASE Bilateral right 2009;   left 2010   D & C HYSTEROSCOPY W/ RESECTION UTERINE FIBROIDS  02/02/2005   DX LAPAROSCOPY W/ LYSIS ADHESIONS  1994;  1995;  1996   LAPAROSCOPIC SALPINGO OOPHERECTOMY Left 01/12/2015   Procedure: LAPAROSCOPY;  Surgeon: Shanda SHAUNNA Muscat, MD;  Location: [facility];  Service: Gynecology;  Laterality: Left;   LAPAROTOMY N/A 01/12/2015   Procedure: LAPAROTOMY;  Surgeon: Shanda SHAUNNA Muscat, MD;  Location: [facility];  Service: Gynecology;  Laterality: N/A;   OOPHORECTOMY Left 01/12/2015   Procedure: OOPHORECTOMY;  Surgeon: Shanda SHAUNNA Muscat, MD;  Location: [facility];  Service: Gynecology;  Laterality: Left;   REPAIR TENDONS RIGHT WRIST W/ REVISION CARPAL TUNNEL RELEASE  2015   VULVECTOMY N/A 09/26/2016   Procedure: WIDE LOCAL EXCISION OF VULVA;  Surgeon: Maurilio Ship, MD;  Location: [facility];  Service: Gynecology;  Laterality: N/A;   WISDOM TOOTH EXTRACTION      Social History   Socioeconomic History   Marital status:  Married    Spouse name: Not on file   Number of children: Not on file   Years of education: Not on file   Highest education level: Not on file  Occupational History   Not on file  Tobacco Use   Smoking status: Never   Smokeless tobacco: Never  Vaping Use   Vaping status: Never Used  Substance and Sexual Activity   Alcohol use: No   Drug use: No   Sexual activity: Yes    Partners: Male    Birth control/protection: Surgical    Comment: HYST   Other Topics Concern   Not on file  Social History Narrative   Not on file   Social Drivers of Health   Financial Resource Strain: Not on file  Food Insecurity: Not on file  Transportation Needs: Not on file  Physical Activity: Not on file  Stress: Not on file  Social Connections: Unknown (11/04/2021)   [facility]   Social Network    Social Network: Not on file  Intimate Partner Violence: Unknown (09/26/2021)   Received from [facility]   HITS    Physically Hurt: Not on file    Insult or Talk Down To: Not on file    Threaten Physical Harm: Not on file    Scream or Curse: Not on file     Family History  Problem Relation Age of Onset   Cancer Paternal Grandmother 29       OVARIAN   Diabetes Maternal Aunt    Cancer Paternal Aunt 60       CERVICAL   Stroke Maternal Grandmother    Hypertension Mother    Cancer Mother        breast   Hypertension Father    Heart attack Father     Current Outpatient Medications  Medication Sig Dispense Refill   albuterol  (VENTOLIN  HFA) 108 (90 Base) MCG/ACT inhaler Inhale 2 puffs into the lungs every 6 (six) hours as needed for wheezing or shortness of breath. 1 each 6   benzonatate (TESSALON) 200 MG capsule Take 1 capsule (200 mg total) by mouth 3 (three) times daily as needed for cough. 60 capsule 1   budesonide -formoterol  (SYMBICORT ) 80-4.5 MCG/ACT inhaler Inhale 2 puffs into the lungs in the morning and at bedtime. with spacer and rinse mouth afterwards. 1 each 6   Cyanocobalamin (VITAMIN B 12 PO) Take 1,000 m by mouth.     diazepam (VALIUM) 10 MG tablet Take 60 mg by mouth daily.     EPINEPHrine  0.3 mg/0.3 mL IJ SOAJ injection Inject 0.3 mg into the muscle as needed for anaphylaxis. 2 each 1   furosemide (LASIX) 40 MG tablet Take 40 mg by mouth daily as needed.     INTRAROSA 6.5 MG INST SMARTSIG:1 Insert Vaginal Every Night     losartan  (COZAAR ) 100 MG tablet Take 100 mg by mouth daily.     potassium chloride  (KLOR-CON ) 10 MEQ tablet Take 10 mEq by mouth daily.     rosuvastatin (CRESTOR) 5 MG tablet Take 5 mg by mouth daily.     telmisartan-hydrochlorothiazide  (MICARDIS HCT) 80-25 MG tablet Take 1 tablet by mouth daily.     traMADol  (ULTRAM ) 50 MG tablet Take 50-100 mg by mouth every 6 (six) hours as needed.     vitamin E 1000 UNIT capsule Take 1,000 Units by mouth every morning.  (Patient not taking: Reported on 04/11/2024)     No current facility-administered  medications for this  visit.    Allergies  Allergen Reactions   Doxycycline Nausea And Vomiting   Hydrocodone Itching   Ibuprofen Nausea Only   Oxycodone  Itching    percocet   Oxycodone -Acetaminophen  Itching   Shellfish Allergy  Rash    REVIEW OF SYSTEMS:   [X]  denotes positive finding, [ ]  denotes negative finding Cardiac  Comments:  Chest pain or chest pressure:    Shortness of breath upon exertion:    Short of breath when lying flat:    Irregular heart rhythm:        Vascular    Pain in calf, thigh, or hip brought on by ambulation:    Pain in feet at night that wakes you up from your sleep:     Blood clot in your veins:    Leg swelling:  x Pedal edema      Pulmonary    Oxygen at home:    Productive cough:     Wheezing:         Neurologic    Sudden weakness in arms or legs:     Sudden numbness in arms or legs:     Sudden onset of difficulty speaking or slurred speech:    Temporary loss of vision in one eye:     Problems with dizziness:         Gastrointestinal    Blood in stool:     Vomited blood:         Genitourinary    Burning when urinating:     Blood in urine:        Psychiatric    Major depression:         Hematologic    Bleeding problems:    Problems with blood clotting too easily:        Skin    Rashes or ulcers:        Constitutional    Fever or chills:      PHYSICAL EXAMINATION:  Today's Vitals   04/18/24 0821  BP: (!) 172/92  Pulse: 87  Temp: 98 F (36.7 C)  TempSrc: Temporal  Weight: 213 lb 9.6 oz (96.9 kg)  PainSc: 0-No pain   Body mass index is 38.14 kg/m.   General:  WDWN in NAD; vital signs documented above Gait: Not observed HENT: WNL, normocephalic Pulmonary: normal non-labored breathing without wheezing Cardiac: regular HR; without carotid bruits Abdomen: soft, NT, aortic pulse is not palpable Skin: without rashes Vascular Exam/Pulses:  Right Left  Radial 2+ (normal) 2+ (normal)  DP 2+ (normal) 2+ (normal)    Extremities: mild pitting edema from ankle socks.  Neurologic: A&O X 3;  moving all extremities equally Psychiatric:  The pt has Normal affect.   Non-Invasive Vascular Imaging:   Venous duplex on 04/11/2024: +--------------+---------+------+-----------+------------+--------+  LEFT         Reflux NoRefluxReflux TimeDiameter cmsComments                          Yes                                   +--------------+---------+------+-----------+------------+--------+  CFV                    yes   >1 second                       +--------------+---------+------+-----------+------------+--------+  FV mid        no                                              +--------------+---------+------+-----------+------------+--------+  Popliteal    no                                              +--------------+---------+------+-----------+------------+--------+  GSV at SFJ              yes    >500 ms      .64               +--------------+---------+------+-----------+------------+--------+  GSV prox thighno                            .44               +--------------+---------+------+-----------+------------+--------+  GSV mid thigh no                            .36               +--------------+---------+------+-----------+------------+--------+  GSV dist thighno                            .35               +--------------+---------+------+-----------+------------+--------+  GSV at knee   no                            .31               +--------------+---------+------+-----------+------------+--------+  GSV prox calf no                            .27               +--------------+---------+------+-----------+------------+--------+  SSV at [facility]    no                            .57               +--------------+---------+------+-----------+------------+--------+  SSV prox calf no                            .37                +--------------+---------+------+-----------+------------+--------+   Summary:  Left:  - No evidence of deep vein thrombosis seen in the left lower extremity,  from the common femoral through the popliteal veins.  - No evidence of superficial venous thrombosis in the left lower extremity.  - Venous reflux is noted in the left common femoral vein.  - Venous reflux is noted in the left sapheno-femoral junction     MARYAH MARINARO is a 58 y.o. female who presents with: bilateral pedal edema    -pt has easily palpable DP pedal pulses bilaterally -in the left lower extremity, the pt does not have evidence of DVT.  Pt  does have venous reflux in the deep CFV and the GSV at the [facility] and no other venous reflux is present and therefore, not a candidate for laser ablation. -discussed with pt about wearing knee high 15-20 mmHg compression stockings and pt was measured for these today.   She prefers to get these at the uniform shop at [facility].  -discussed the importance of leg elevation and how to elevate properly - pt is advised to elevate their legs and a diagram is given to them to demonstrate for pt to lay flat on their back with knees elevated and slightly bent with their feet higher than their knees, which puts their feet higher than their heart for 15 minutes per day.  If pt cannot lay flat, advised to lay as flat as possible.  -pt is advised to continue as much walking as possible and avoid sitting or standing for long periods of time.  -discussed importance of maintaining healthy weight and exercise and that water aerobics would also be beneficial.  She states that her PCP just discussed water exercise with her yesterday and she is going to contact the [facility].   -handout with recommendations given -pt will f/u as needed.    Lucie Apt, [facility] [phone]  Clinic MD:  [place]

## 2024-04-18 ENCOUNTER — Ambulatory Visit: Attending: Vascular Surgery | Admitting: Physician Assistant

## 2024-04-18 ENCOUNTER — Encounter: Payer: Self-pay | Admitting: Physician Assistant

## 2024-04-18 VITALS — BP 172/92 | HR 87 | Temp 98.0°F | Wt 213.6 lb

## 2024-04-18 DIAGNOSIS — R600 Localized edema: Secondary | ICD-10-CM

## 2024-07-25 ENCOUNTER — Ambulatory Visit
